# Patient Record
Sex: Female | Born: 1935 | Race: White | Hispanic: No | Smoking: Never smoker
Health system: Southern US, Community
[De-identification: ages and names within clinical notes are randomized; demographics above are authoritative.]

## PROBLEM LIST (undated history)

## (undated) DIAGNOSIS — I10 Essential (primary) hypertension: Secondary | ICD-10-CM

## (undated) DIAGNOSIS — E079 Disorder of thyroid, unspecified: Secondary | ICD-10-CM

## (undated) DIAGNOSIS — J449 Chronic obstructive pulmonary disease, unspecified: Secondary | ICD-10-CM

## (undated) DIAGNOSIS — J45909 Unspecified asthma, uncomplicated: Secondary | ICD-10-CM

## (undated) DIAGNOSIS — E785 Hyperlipidemia, unspecified: Secondary | ICD-10-CM

## (undated) HISTORY — PX: TONSILLECTOMY: SUR1361

---

## 2018-09-09 ENCOUNTER — Encounter (HOSPITAL_BASED_OUTPATIENT_CLINIC_OR_DEPARTMENT_OTHER): Payer: Self-pay | Admitting: Emergency Medicine

## 2018-09-09 ENCOUNTER — Emergency Department (HOSPITAL_BASED_OUTPATIENT_CLINIC_OR_DEPARTMENT_OTHER)
Admission: EM | Admit: 2018-09-09 | Discharge: 2018-09-09 | Disposition: A | Discharge status: Home / Self Care | Payer: Medicare Other | Attending: Emergency Medicine | Admitting: Emergency Medicine

## 2018-09-09 ENCOUNTER — Emergency Department (HOSPITAL_BASED_OUTPATIENT_CLINIC_OR_DEPARTMENT_OTHER): Payer: Medicare Other

## 2018-09-09 ENCOUNTER — Other Ambulatory Visit: Payer: Self-pay

## 2018-09-09 DIAGNOSIS — J209 Acute bronchitis, unspecified: Secondary | ICD-10-CM | POA: Insufficient documentation

## 2018-09-09 DIAGNOSIS — R0602 Shortness of breath: Secondary | ICD-10-CM | POA: Diagnosis present

## 2018-09-09 HISTORY — DX: Hyperlipidemia, unspecified: E78.5

## 2018-09-09 HISTORY — DX: Essential (primary) hypertension: I10

## 2018-09-09 HISTORY — DX: Unspecified asthma, uncomplicated: J45.909

## 2018-09-09 HISTORY — DX: Disorder of thyroid, unspecified: E07.9

## 2018-09-09 LAB — BASIC METABOLIC PANEL
Anion gap: 6 (ref 5–15)
BUN: 22 mg/dL (ref 8–23)
CO2: 29 mmol/L (ref 22–32)
Calcium: 8.9 mg/dL (ref 8.9–10.3)
Chloride: 105 mmol/L (ref 98–111)
Creatinine, Ser: 0.96 mg/dL (ref 0.44–1.00)
GFR calc Af Amer: 60 mL/min — ABNORMAL LOW (ref >60)
GFR calc non Af Amer: 51 mL/min — ABNORMAL LOW (ref >60)
Glucose, Bld: 131 mg/dL — ABNORMAL HIGH (ref 70–99)
Potassium: 3.4 mmol/L — ABNORMAL LOW (ref 3.5–5.1)
Sodium: 140 mmol/L (ref 135–145)

## 2018-09-09 LAB — CBC WITH DIFFERENTIAL/PLATELET
Abs Immature Granulocytes: 0.03 10*3/uL (ref 0.00–0.07)
Basophils Absolute: 0.1 10*3/uL (ref 0.0–0.1)
Basophils Relative: 1 %
Eosinophils Absolute: 1 10*3/uL — ABNORMAL HIGH (ref 0.0–0.5)
Eosinophils Relative: 12 %
HCT: 42.9 % (ref 36.0–46.0)
Hemoglobin: 13.9 g/dL (ref 12.0–15.0)
Immature Granulocytes: 0 %
Lymphocytes Relative: 21 %
Lymphs Abs: 1.7 10*3/uL (ref 0.7–4.0)
MCH: 31.4 pg (ref 26.0–34.0)
MCHC: 32.4 g/dL (ref 30.0–36.0)
MCV: 97.1 fL (ref 80.0–100.0)
Monocytes Absolute: 1.1 10*3/uL — ABNORMAL HIGH (ref 0.1–1.0)
Monocytes Relative: 14 %
Neutro Abs: 4.2 10*3/uL (ref 1.7–7.7)
Neutrophils Relative %: 52 %
Platelets: 258 10*3/uL (ref 150–400)
RBC: 4.42 MIL/uL (ref 3.87–5.11)
RDW: 13.9 % (ref 11.5–15.5)
WBC: 8.1 10*3/uL (ref 4.0–10.5)
nRBC: 0 % (ref 0.0–0.2)

## 2018-09-09 MED ORDER — METHYLPREDNISOLONE SODIUM SUCC 125 MG IJ SOLR
125.0000 mg | Freq: Once | INTRAMUSCULAR | Status: AC
  Administered 2018-09-09: 13:00:00 125 mg via INTRAVENOUS
  Filled 2018-09-09: qty 2

## 2018-09-09 MED ORDER — DOXYCYCLINE HYCLATE 100 MG PO CAPS: 100.0000 mg | ORAL_CAPSULE | Freq: Two times a day (BID) | ORAL | 0 refills | Status: DC

## 2018-09-09 MED ORDER — PREDNISONE 20 MG PO TABS: 60.0000 mg | ORAL_TABLET | Freq: Every day | ORAL | 0 refills | Status: DC

## 2018-09-09 MED ORDER — IPRATROPIUM-ALBUTEROL 0.5-2.5 (3) MG/3ML IN SOLN
3.0000 mL | Freq: Once | RESPIRATORY_TRACT | Status: AC
  Administered 2018-09-09: 12:00:00 3 mL via RESPIRATORY_TRACT
  Filled 2018-09-09: qty 3

## 2018-09-09 NOTE — Discharge Instructions (Signed)
You were seen in the emergency department for increased shortness of breath.  You had lab work and a chest x-ray that did not show an obvious pneumonia.  We are treating you for bronchitis with a steroid called prednisone and an antibiotic called doxycycline.  Please finish your medications.  You can continue to use your inhaler every 4-6 hours as needed.  Follow-up with your doctor.  Return if any worsening symptoms.

## 2018-09-09 NOTE — ED Triage Notes (Signed)
SOB x1 month. Worse today, cough, with green mucus

## 2018-09-09 NOTE — ED Provider Notes (Signed)
[facility] EMERGENCY DEPARTMENT Provider Note   CSN: [id number] Arrival date & time: 09/09/18  1140    History   Chief Complaint Chief Complaint  Patient presents with  . Shortness of Breath    HPI Lynn Knox is a 85 y.o. female.  She presents the emergency department with complaint of 1 week of increased shortness of breath.  She said she gets this every spring when the pollen comes out and it causes her to wheeze.  She has been using her albuterol with some improvement.  She is coughing and intermittently will get up some sputum.  She said she had to call 911 last night because of her shortness of breath but she refused transport.  No fevers or chills no sick contacts or recent travel.     The history is provided by the patient.  Shortness of Breath  Severity:  Moderate Onset quality:  Gradual Timing:  Constant Progression:  Waxing and waning Chronicity:  Recurrent Context: pollens   Relieved by:  None tried Worsened by:  Activity and coughing Ineffective treatments:  Inhaler Associated symptoms: cough, sputum production and wheezing   Associated symptoms: no abdominal pain, no chest pain, no diaphoresis, no fever, no headaches, no hemoptysis, no neck pain, no rash, no sore throat and no vomiting     No past medical history on file.  There are no active problems to display for this patient.   Past Surgical History:  Procedure Laterality Date  . TONSILLECTOMY       OB History   No obstetric history on file.      Home Medications    Prior to Admission medications   Medication Sig Start Date End Date Taking? Authorizing Provider  doxycycline (VIBRAMYCIN) 100 MG capsule Take 1 capsule (100 mg total) by mouth 2 (two) times daily. 09/09/18   Terrilee FilesButler, Michael C, MD  predniSONE (DELTASONE) 20 MG tablet Take 3 tablets (60 mg total) by mouth daily. 09/09/18   Terrilee FilesButler, Michael C, MD    Family History No family history on file.  Social History Social History    Tobacco Use  . Smoking status: Never Smoker  . Smokeless tobacco: Never Used  Substance Use Topics  . Alcohol use: Never    Frequency: Never  . Drug use: Never     Allergies   Patient has no allergy information on record.   Review of Systems Review of Systems  Constitutional: Negative for diaphoresis and fever.  HENT: Negative for sore throat.   Eyes: Negative for visual disturbance.  Respiratory: Positive for cough, sputum production, shortness of breath and wheezing. Negative for hemoptysis.   Cardiovascular: Negative for chest pain.  Gastrointestinal: Negative for abdominal pain and vomiting.  Genitourinary: Negative for dysuria.  Musculoskeletal: Negative for neck pain.  Skin: Negative for rash.  Neurological: Negative for headaches.     Physical Exam Updated Vital Signs BP (!) 142/62 (BP Location: Left Arm)   Pulse 95   Temp 98.1 F (36.7 C) (Oral)   Resp 16   SpO2 94%   Physical Exam Vitals signs and nursing note reviewed.  Constitutional:      General: She is not in acute distress.    Appearance: She is well-developed.  HENT:     Head: Normocephalic and atraumatic.  Eyes:     Conjunctiva/sclera: Conjunctivae normal.  Neck:     Musculoskeletal: Neck supple.  Cardiovascular:     Rate and Rhythm: Normal rate and regular rhythm.  Heart sounds: No murmur.  Pulmonary:     Effort: Pulmonary effort is normal. No respiratory distress.     Breath sounds: Wheezing present.  Abdominal:     Palpations: Abdomen is soft.     Tenderness: There is no abdominal tenderness.  Musculoskeletal:        General: No tenderness or signs of injury.     Right lower leg: Edema present.     Left lower leg: Edema present.  Skin:    General: Skin is warm and dry.     Capillary Refill: Capillary refill takes less than 2 seconds.  Neurological:     General: No focal deficit present.     Mental Status: She is alert. Mental status is at baseline.      ED Treatments /  Results  Labs (all labs ordered are listed, but only abnormal results are displayed) Labs Reviewed  BASIC METABOLIC PANEL - Abnormal; Notable for the following components:      Result Value   Potassium 3.4 (*)    Glucose, Bld 131 (*)    GFR calc non Af Amer 51 (*)    GFR calc Af Amer 60 (*)    All other components within normal limits  CBC WITH DIFFERENTIAL/PLATELET - Abnormal; Notable for the following components:   Monocytes Absolute 1.1 (*)    Eosinophils Absolute 1.0 (*)    All other components within normal limits    EKG EKG Interpretation  Date/Time:  Saturday Sep 09 2018 12:24:56 EDT Ventricular Rate:  86 PR Interval:    QRS Duration: 93 QT Interval:  404 QTC Calculation: 484 R Axis:   -12 Text Interpretation:  Sinus rhythm Low voltage, extremity and precordial leads Baseline wander in lead(s) V2 V5 no prior to compare with Confirmed by Meridee Score [phone]) on 09/09/2018 12:27:16 PM   Radiology Dg Chest Port 1 View  Result Date: 09/09/2018 CLINICAL DATA:  Shortness of breath for 1 month EXAM: PORTABLE CHEST 1 VIEW COMPARISON:  None. FINDINGS: The lungs are hyperinflated likely secondary to COPD. There is no focal parenchymal opacity. There is no pleural effusion or pneumothorax. The heart and mediastinal contours are unremarkable. The osseous structures are unremarkable. IMPRESSION: No active disease. Electronically Signed   By: Elige Ko   On: 09/09/2018 12:56    Procedures Procedures (including critical care time)  Medications Ordered in ED Medications  ipratropium-albuterol (DUONEB) 0.5-2.5 (3) MG/3ML nebulizer solution 3 mL (has no administration in time range)  methylPREDNISolone sodium succinate (SOLU-MEDROL) 125 mg/2 mL injection 125 mg (has no administration in time range)     Initial Impression / Assessment and Plan / ED Course  I have reviewed the triage vital signs and the nursing notes.  Pertinent labs & imaging results that were available during  my care of the patient were reviewed by me and considered in my medical decision making (see chart for details).  Clinical Course as of Sep 09 1839  Sat Sep 09, 2018  3049 84 year old female with history of seasonal allergies and shortness of breath here with increased shortness of breath over the last week or so.  She has diffuse wheezing on exam although is satting 94% on room air.  Speaking in full sentences.  Differential diagnosis includes reactive airway disease, pneumonia, Covid, bronchitis/COPD.   [MB]  1306 Improved symptoms after DuoNeb.  Labs unremarkable and chest x-ray with no active disease.  Will send home with a prescription for antibiotics and steroids.   [MB]  Clinical Course User Index [MB] Terrilee Files, MD   Earlyn Sylvan was evaluated in Emergency Department on 09/09/2018 for the symptoms described in the history of present illness. She was evaluated in the context of the global COVID-19 pandemic, which necessitated consideration that the patient might be at risk for infection with the SARS-CoV-2 virus that causes COVID-19. Institutional protocols and algorithms that pertain to the evaluation of patients at risk for COVID-19 are in a state of rapid change based on information released by regulatory bodies including the CDC and federal and state organizations. These policies and algorithms were followed during the patient's care in the ED.      Final Clinical Impressions(s) / ED Diagnoses   Final diagnoses:  SOB (shortness of breath)  Acute bronchitis, unspecified organism    ED Discharge Orders         Ordered    predniSONE (DELTASONE) 20 MG tablet  Daily     09/09/18 1235    doxycycline (VIBRAMYCIN) 100 MG capsule  2 times daily     09/09/18 1235           Terrilee Files, MD 09/09/18 1842

## 2018-12-26 ENCOUNTER — Emergency Department (HOSPITAL_BASED_OUTPATIENT_CLINIC_OR_DEPARTMENT_OTHER): Payer: Medicare Other

## 2018-12-26 ENCOUNTER — Other Ambulatory Visit: Payer: Self-pay

## 2018-12-26 ENCOUNTER — Emergency Department (HOSPITAL_BASED_OUTPATIENT_CLINIC_OR_DEPARTMENT_OTHER)
Admission: EM | Admit: 2018-12-26 | Discharge: 2018-12-26 | Disposition: A | Discharge status: Home / Self Care | Payer: Medicare Other | Attending: Emergency Medicine | Admitting: Emergency Medicine

## 2018-12-26 ENCOUNTER — Encounter (HOSPITAL_BASED_OUTPATIENT_CLINIC_OR_DEPARTMENT_OTHER): Payer: Self-pay

## 2018-12-26 DIAGNOSIS — Z20828 Contact with and (suspected) exposure to other viral communicable diseases: Secondary | ICD-10-CM | POA: Insufficient documentation

## 2018-12-26 DIAGNOSIS — J441 Chronic obstructive pulmonary disease with (acute) exacerbation: Secondary | ICD-10-CM | POA: Insufficient documentation

## 2018-12-26 DIAGNOSIS — R05 Cough: Secondary | ICD-10-CM

## 2018-12-26 DIAGNOSIS — E079 Disorder of thyroid, unspecified: Secondary | ICD-10-CM | POA: Insufficient documentation

## 2018-12-26 DIAGNOSIS — I10 Essential (primary) hypertension: Secondary | ICD-10-CM | POA: Diagnosis not present

## 2018-12-26 DIAGNOSIS — E785 Hyperlipidemia, unspecified: Secondary | ICD-10-CM | POA: Insufficient documentation

## 2018-12-26 DIAGNOSIS — Z7952 Long term (current) use of systemic steroids: Secondary | ICD-10-CM | POA: Insufficient documentation

## 2018-12-26 DIAGNOSIS — Z881 Allergy status to other antibiotic agents status: Secondary | ICD-10-CM | POA: Insufficient documentation

## 2018-12-26 DIAGNOSIS — R059 Cough, unspecified: Secondary | ICD-10-CM

## 2018-12-26 DIAGNOSIS — R0602 Shortness of breath: Secondary | ICD-10-CM | POA: Diagnosis present

## 2018-12-26 LAB — LACTIC ACID, PLASMA: Lactic Acid, Venous: 1.4 mmol/L (ref 0.5–1.9)

## 2018-12-26 LAB — CBC
HCT: 42.6 % (ref 36.0–46.0)
Hemoglobin: 13.5 g/dL (ref 12.0–15.0)
MCH: 31.3 pg (ref 26.0–34.0)
MCHC: 31.7 g/dL (ref 30.0–36.0)
MCV: 98.8 fL (ref 80.0–100.0)
Platelets: 331 10*3/uL (ref 150–400)
RBC: 4.31 MIL/uL (ref 3.87–5.11)
RDW: 13.7 % (ref 11.5–15.5)
WBC: 9.4 10*3/uL (ref 4.0–10.5)
nRBC: 0 % (ref 0.0–0.2)

## 2018-12-26 LAB — BASIC METABOLIC PANEL
Anion gap: 11 (ref 5–15)
BUN: 26 mg/dL — ABNORMAL HIGH (ref 8–23)
CO2: 26 mmol/L (ref 22–32)
Calcium: 9.4 mg/dL (ref 8.9–10.3)
Chloride: 104 mmol/L (ref 98–111)
Creatinine, Ser: 1.15 mg/dL — ABNORMAL HIGH (ref 0.44–1.00)
GFR calc Af Amer: 48 mL/min — ABNORMAL LOW (ref >60)
GFR calc non Af Amer: 41 mL/min — ABNORMAL LOW (ref >60)
Glucose, Bld: 158 mg/dL — ABNORMAL HIGH (ref 70–99)
Potassium: 3.5 mmol/L (ref 3.5–5.1)
Sodium: 141 mmol/L (ref 135–145)

## 2018-12-26 LAB — BRAIN NATRIURETIC PEPTIDE: B Natriuretic Peptide: 88.4 pg/mL (ref 0.0–100.0)

## 2018-12-26 LAB — SARS CORONAVIRUS 2 BY RT PCR (HOSPITAL ORDER, PERFORMED IN ~~LOC~~ HOSPITAL LAB): SARS Coronavirus 2: NEGATIVE

## 2018-12-26 LAB — TROPONIN I (HIGH SENSITIVITY): Troponin I (High Sensitivity): 10 ng/L (ref <18)

## 2018-12-26 MED ORDER — ALBUTEROL SULFATE (2.5 MG/3ML) 0.083% IN NEBU
INHALATION_SOLUTION | RESPIRATORY_TRACT | Status: AC
  Administered 2018-12-26: 5 mg
  Filled 2018-12-26: qty 6

## 2018-12-26 MED ORDER — METHYLPREDNISOLONE SODIUM SUCC 125 MG IJ SOLR
125.0000 mg | Freq: Once | INTRAMUSCULAR | Status: AC
  Administered 2018-12-26: 125 mg via INTRAVENOUS
  Filled 2018-12-26: qty 2

## 2018-12-26 MED ORDER — PREDNISONE 20 MG PO TABS: 60.0000 mg | ORAL_TABLET | Freq: Every day | ORAL | 0 refills | Status: DC

## 2018-12-26 MED ORDER — IPRATROPIUM-ALBUTEROL 0.5-2.5 (3) MG/3ML IN SOLN
3.0000 mL | Freq: Four times a day (QID) | RESPIRATORY_TRACT | Status: DC
  Administered 2018-12-26: 3 mL via RESPIRATORY_TRACT
  Filled 2018-12-26: qty 3

## 2018-12-26 MED ORDER — IPRATROPIUM BROMIDE 0.02 % IN SOLN
RESPIRATORY_TRACT | Status: AC
  Administered 2018-12-26: 0.5 mg
  Filled 2018-12-26: qty 2.5

## 2018-12-26 MED ORDER — AZITHROMYCIN 250 MG PO TABS: 250.0000 mg | ORAL_TABLET | Freq: Every day | ORAL | 0 refills | Status: DC

## 2018-12-26 NOTE — ED Provider Notes (Signed)
[facility] EMERGENCY DEPARTMENT Provider Note   CSN: [id number] Arrival date & time: 12/26/18  1941     History   Chief Complaint Chief Complaint  Patient presents with  . Shortness of Breath    HPI Lynn Knox is a 85 y.o. female.  She said she has been short of breath and coughing since she arrived here about 3 or 4 years ago.  Her son is here and gives a little bit of extra history of same she has had a course of antibiotics once or twice over the last year and seems to improve but then gets back being short of breath again.  She is never carried any pulmonary diagnosis but she was a former smoker.  No fever no chest pain.  She has had a cough productive of some dark-colored green sputum.  No abdominal pain vomiting or diarrhea.  Denies any Covid exposures.     The history is provided by the patient and a relative.  Shortness of Breath Severity:  Moderate Onset quality:  Unable to specify Timing:  Constant Progression:  Worsening Chronicity:  Recurrent Context: activity   Relieved by:  Nothing Worsened by:  Activity Ineffective treatments:  Inhaler Associated symptoms: cough and sputum production   Associated symptoms: no abdominal pain, no chest pain, no fever, no headaches, no hemoptysis, no neck pain, no rash, no sore throat and no vomiting     Past Medical History:  Diagnosis Date  . Asthma   . Hyperlipemia   . Hypertension   . Thyroid disease     There are no active problems to display for this patient.   Past Surgical History:  Procedure Laterality Date  . TONSILLECTOMY       OB History   No obstetric history on file.      Home Medications    Prior to Admission medications   Medication Sig Start Date End Date Taking? Authorizing Provider  doxycycline (VIBRAMYCIN) 100 MG capsule Take 1 capsule (100 mg total) by mouth 2 (two) times daily. 09/09/18   Terrilee FilesButler, Kiyoshi Schaab C, MD  predniSONE (DELTASONE) 20 MG tablet Take 3 tablets (60 mg total) by  mouth daily. 09/09/18   Terrilee FilesButler, Kelsei Defino C, MD    Family History No family history on file.  Social History Social History   Tobacco Use  . Smoking status: Never Smoker  . Smokeless tobacco: Never Used  Substance Use Topics  . Alcohol use: Never    Frequency: Never  . Drug use: Never     Allergies   Doxycycline   Review of Systems Review of Systems  Constitutional: Negative for fever.  HENT: Negative for sore throat.   Eyes: Negative for visual disturbance.  Respiratory: Positive for cough, sputum production and shortness of breath. Negative for hemoptysis.   Cardiovascular: Positive for leg swelling. Negative for chest pain.  Gastrointestinal: Negative for abdominal pain and vomiting.  Genitourinary: Negative for dysuria.  Musculoskeletal: Negative for neck pain.  Skin: Negative for rash.  Neurological: Negative for headaches.     Physical Exam Updated Vital Signs BP 136/78   Pulse 84   Temp 98 F (36.7 C) (Oral)   Resp 18   Wt 71.7 kg   SpO2 98%   BMI 29.85 kg/m   Physical Exam Vitals signs and nursing note reviewed.  Constitutional:      General: She is not in acute distress.    Appearance: She is well-developed.  HENT:     Head: Normocephalic and  atraumatic.  Eyes:     Conjunctiva/sclera: Conjunctivae normal.  Neck:     Musculoskeletal: Neck supple.  Cardiovascular:     Rate and Rhythm: Normal rate and regular rhythm.     Heart sounds: No murmur.  Pulmonary:     Effort: Tachypnea and accessory muscle usage present. No respiratory distress.     Breath sounds: Wheezing present.  Abdominal:     Palpations: Abdomen is soft.     Tenderness: There is no abdominal tenderness.  Musculoskeletal: Normal range of motion.     Right lower leg: She exhibits no tenderness. Edema present.     Left lower leg: She exhibits no tenderness. Edema present.  Skin:    General: Skin is warm and dry.     Capillary Refill: Capillary refill takes less than 2 seconds.   Neurological:     General: No focal deficit present.     Mental Status: She is alert. Mental status is at baseline.      ED Treatments / Results  Labs (all labs ordered are listed, but only abnormal results are displayed) Labs Reviewed  BASIC METABOLIC PANEL - Abnormal; Notable for the following components:      Result Value   Glucose, Bld 158 (*)    BUN 26 (*)    Creatinine, Ser 1.15 (*)    GFR calc non Af Amer 41 (*)    GFR calc Af Amer 48 (*)    All other components within normal limits  SARS CORONAVIRUS 2 (HOSPITAL ORDER, PERFORMED IN [facility])  LACTIC ACID, PLASMA  CBC  BRAIN NATRIURETIC PEPTIDE  TROPONIN I (HIGH SENSITIVITY)  TROPONIN I (HIGH SENSITIVITY)    EKG EKG Interpretation  Date/Time:  Tuesday December 26 2018 19:53:07 EDT Ventricular Rate:  108 PR Interval:    QRS Duration: 91 QT Interval:  347 QTC Calculation: 466 R Axis:   37 Text Interpretation:  Sinus tachycardia Low voltage, precordial leads similar to prior 5/20 Confirmed by Meridee ScoreButler, Zaccheaus Storlie [phone]([id number]) on 12/26/2018 8:03:31 PM   Radiology Dg Chest Port 1 View  Result Date: 12/26/2018 CLINICAL DATA:  Shortness of breath EXAM: PORTABLE CHEST 1 VIEW COMPARISON:  Sep 09, 2018 FINDINGS: The heart size is unchanged. Aortic calcifications are noted. The thoracic aorta is tortuous and likely enlarged. There is no pneumothorax. No significant pleural effusion. No acute osseous abnormality. IMPRESSION: 1. No acute cardiopulmonary process. 2. Tortuous and likely enlarged thoracic aorta, similar to prior x-ray dated Sep 09, 2018. Consider outpatient nonemergent CT for further evaluation of this finding. Electronically Signed   By: Katherine Mantlehristopher  Green M.D.   On: 12/26/2018 20:53    Procedures Procedures (including critical care time)  Medications Ordered in ED Medications  albuterol (PROVENTIL) (2.5 MG/3ML) 0.083% nebulizer solution (5 mg  Given 12/26/18 1955)  ipratropium (ATROVENT) 0.02 % nebulizer  solution (0.5 mg  Given 12/26/18 1955)  methylPREDNISolone sodium succinate (SOLU-MEDROL) 125 mg/2 mL injection 125 mg (125 mg Intravenous Given 12/26/18 2037)     Initial Impression / Assessment and Plan / ED Course  I have reviewed the triage vital signs and the nursing notes.  Pertinent labs & imaging results that were available during my care of the patient were reviewed by me and considered in my medical decision making (see chart for details).  Clinical Course as of Dec 26 913  Tue Dec 26, 2018  2107 Patient here with what sounds like a longstanding shortness of breath and wheezing.  She does not carry  the diagnosis of COPD.  Differential includes COPD, asthma, bronchitis, pneumonia, Covid, CHF.  BNP is reassuring at 88.  Rest of her lab work shows a normal white count normal lactic acid.  Chest x-ray is read as negative other than a tortuous aorta seen on priors.   [MB]  2107 She is received some breathing treatments and steroids here.   [MB]  2141 I reevaluated the patient.  She is satting well on room air and speaking in full sentences.  Her wheezing is improved although still ongoing.  Her lab work is fairly unremarkable and her chest x-ray and COVID testing were unremarkable.  We will give her some more neb here and likely can be discharged on steroids and inhaler treatments   [MB]    Clinical Course User Index [MB] Hayden Rasmussen, MD        Final Clinical Impressions(s) / ED Diagnoses   Final diagnoses:  COPD exacerbation [facility])    ED Discharge Orders         Ordered    predniSONE (DELTASONE) 20 MG tablet  Daily     12/26/18 2248    azithromycin (ZITHROMAX) 250 MG tablet  Daily     12/26/18 2248           Hayden Rasmussen, MD 12/27/18 [phone]

## 2018-12-26 NOTE — ED Notes (Signed)
ED Provider at bedside. 

## 2018-12-26 NOTE — ED Triage Notes (Signed)
Pt with increase in resp distress x 2 days-was treated with broad spectrum abx per grandson-pt taken to tx area via w/c-RT in upon arrival to tx area

## 2018-12-26 NOTE — ED Notes (Signed)
X-ray at bedside

## 2018-12-26 NOTE — Discharge Instructions (Signed)
You were seen in the emergency department for increased cough and shortness of breath.  You had blood work EKG and a chest x-ray that did not show any serious findings.  We are treating you for possible COPD with steroids and a course of antibiotics.  Please continue to use your inhaler.  It will be important for you to establish follow-up with a primary care doctor.  Return if any worsening symptoms.

## 2019-03-19 ENCOUNTER — Emergency Department (HOSPITAL_BASED_OUTPATIENT_CLINIC_OR_DEPARTMENT_OTHER)
Admission: EM | Admit: 2019-03-19 | Discharge: 2019-03-19 | Disposition: A | Discharge status: Home / Self Care | Payer: Medicare Other | Attending: Emergency Medicine | Admitting: Emergency Medicine

## 2019-03-19 ENCOUNTER — Encounter (HOSPITAL_BASED_OUTPATIENT_CLINIC_OR_DEPARTMENT_OTHER): Payer: Self-pay | Admitting: *Deleted

## 2019-03-19 ENCOUNTER — Other Ambulatory Visit: Payer: Self-pay

## 2019-03-19 ENCOUNTER — Emergency Department (HOSPITAL_BASED_OUTPATIENT_CLINIC_OR_DEPARTMENT_OTHER): Payer: Medicare Other

## 2019-03-19 DIAGNOSIS — J441 Chronic obstructive pulmonary disease with (acute) exacerbation: Secondary | ICD-10-CM | POA: Diagnosis not present

## 2019-03-19 DIAGNOSIS — Z79899 Other long term (current) drug therapy: Secondary | ICD-10-CM | POA: Diagnosis not present

## 2019-03-19 DIAGNOSIS — E079 Disorder of thyroid, unspecified: Secondary | ICD-10-CM | POA: Insufficient documentation

## 2019-03-19 DIAGNOSIS — R0602 Shortness of breath: Secondary | ICD-10-CM | POA: Diagnosis present

## 2019-03-19 DIAGNOSIS — I10 Essential (primary) hypertension: Secondary | ICD-10-CM | POA: Diagnosis not present

## 2019-03-19 DIAGNOSIS — Z881 Allergy status to other antibiotic agents status: Secondary | ICD-10-CM | POA: Diagnosis not present

## 2019-03-19 DIAGNOSIS — E785 Hyperlipidemia, unspecified: Secondary | ICD-10-CM | POA: Diagnosis not present

## 2019-03-19 LAB — BASIC METABOLIC PANEL
Anion gap: 10 (ref 5–15)
BUN: 23 mg/dL (ref 8–23)
CO2: 27 mmol/L (ref 22–32)
Calcium: 9.6 mg/dL (ref 8.9–10.3)
Chloride: 103 mmol/L (ref 98–111)
Creatinine, Ser: 0.98 mg/dL (ref 0.44–1.00)
GFR calc Af Amer: 58 mL/min — ABNORMAL LOW (ref >60)
GFR calc non Af Amer: 50 mL/min — ABNORMAL LOW (ref >60)
Glucose, Bld: 119 mg/dL — ABNORMAL HIGH (ref 70–99)
Potassium: 3.3 mmol/L — ABNORMAL LOW (ref 3.5–5.1)
Sodium: 140 mmol/L (ref 135–145)

## 2019-03-19 LAB — CBC
HCT: 43 % (ref 36.0–46.0)
Hemoglobin: 13.6 g/dL (ref 12.0–15.0)
MCH: 30.8 pg (ref 26.0–34.0)
MCHC: 31.6 g/dL (ref 30.0–36.0)
MCV: 97.3 fL (ref 80.0–100.0)
Platelets: 297 10*3/uL (ref 150–400)
RBC: 4.42 MIL/uL (ref 3.87–5.11)
RDW: 13.5 % (ref 11.5–15.5)
WBC: 8.2 10*3/uL (ref 4.0–10.5)
nRBC: 0 % (ref 0.0–0.2)

## 2019-03-19 MED ORDER — ALBUTEROL SULFATE (2.5 MG/3ML) 0.083% IN NEBU
5.0000 mg | INHALATION_SOLUTION | Freq: Once | RESPIRATORY_TRACT | Status: AC
  Administered 2019-03-19: 5 mg via RESPIRATORY_TRACT
  Filled 2019-03-19: qty 6

## 2019-03-19 MED ORDER — IPRATROPIUM BROMIDE 0.02 % IN SOLN
0.5000 mg | Freq: Once | RESPIRATORY_TRACT | Status: AC
  Administered 2019-03-19: 0.5 mg via RESPIRATORY_TRACT
  Filled 2019-03-19: qty 2.5

## 2019-03-19 MED ORDER — METHYLPREDNISOLONE SODIUM SUCC 125 MG IJ SOLR
125.0000 mg | Freq: Once | INTRAMUSCULAR | Status: AC
  Administered 2019-03-19: 125 mg via INTRAVENOUS
  Filled 2019-03-19: qty 2

## 2019-03-19 MED ORDER — PREDNISONE 20 MG PO TABS: 40.0000 mg | ORAL_TABLET | Freq: Every day | ORAL | 0 refills | Status: AC

## 2019-03-19 MED ORDER — AZITHROMYCIN 250 MG PO TABS: ORAL_TABLET | ORAL | 0 refills | Status: DC

## 2019-03-19 NOTE — ED Triage Notes (Signed)
Short of breath,  Coughing phellem  Dark color and green

## 2019-03-19 NOTE — Discharge Instructions (Addendum)
Take the medications as prescribed, follow-up with your primary care doctor later this week to make sure your symptoms are improving, return to the ED as needed for worsening symptoms

## 2019-03-19 NOTE — ED Provider Notes (Signed)
[facility] EMERGENCY DEPARTMENT Provider Note   CSN: [id number] Arrival date & time: 03/19/19  1809     History   Chief Complaint Chief Complaint  Patient presents with  . Shortness of Breath    HPI Lynn Knox is a 85 y.o. female.     HPI Patient presents to the emergency room for evaluation of shortness of breath.  Patient has a history of COPD.  She has had recurrent episodes in the past.  Patient started having symptoms again this past week.  Patient has been avoiding her doctor's office because of COVID-19 but her symptoms became too severe tonight.  She has been wheezing and has tried to use her inhaler but her son states she has a difficult time coordinating the inhaler use.  No fevers or chills.  No chest pain.  No leg swelling.  No other complaints. Past Medical History:  Diagnosis Date  . Asthma   . Hyperlipemia   . Hypertension   . Thyroid disease     There are no active problems to display for this patient.   Past Surgical History:  Procedure Laterality Date  . TONSILLECTOMY       OB History   No obstetric history on file.      Home Medications    Prior to Admission medications   Medication Sig Start Date End Date Taking? Authorizing Provider  albuterol (VENTOLIN HFA) 108 (90 Base) MCG/ACT inhaler Inhale 2 puffs into the lungs every 6 (six) hours as needed. 03/09/19   [provider]  azithromycin (ZITHROMAX Z-PAK) 250 MG tablet Take 2 tabs by mouth on day 1 then 1 tab by mouth daily on days 2-5 03/19/19   Linwood Dibbles, MD  hydrochlorothiazide (HYDRODIURIL) 25 MG tablet Take 25 mg by mouth daily. 01/14/19   [provider]  levothyroxine (SYNTHROID) 25 MCG tablet Take 25 mcg by mouth daily. 01/14/19   [provider]  lisinopril (ZESTRIL) 20 MG tablet Take 20 mg by mouth daily. 01/14/19   [provider]  pravastatin (PRAVACHOL) 20 MG tablet Take 20 mg by mouth daily. 01/14/19   [provider]  predniSONE  (DELTASONE) 20 MG tablet Take 2 tablets (40 mg total) by mouth daily for 5 days. 03/19/19 03/24/19  Linwood Dibbles, MD    Family History No family history on file.  Social History Social History   Tobacco Use  . Smoking status: Never Smoker  . Smokeless tobacco: Never Used  Substance Use Topics  . Alcohol use: Never    Frequency: Never  . Drug use: Never     Allergies   Doxycycline   Review of Systems Review of Systems  All other systems reviewed and are negative.    Physical Exam Updated Vital Signs BP (!) 145/48   Pulse 89   Temp 98.1 F (36.7 C) (Oral)   Resp (!) 21   Ht 1.626 m (5\' 4" )   Wt 74.8 kg   SpO2 97%   BMI 28.32 kg/m   Physical Exam Vitals signs and nursing note reviewed.  Constitutional:      General: She is not in acute distress.    Appearance: She is well-developed.  HENT:     Head: Normocephalic and atraumatic.     Right Ear: External ear normal.     Left Ear: External ear normal.  Eyes:     General: No scleral icterus.       Right eye: No discharge.  Left eye: No discharge.     Conjunctiva/sclera: Conjunctivae normal.  Neck:     Musculoskeletal: Neck supple.     Trachea: No tracheal deviation.  Cardiovascular:     Rate and Rhythm: Normal rate and regular rhythm.  Pulmonary:     Effort: Pulmonary effort is normal. No respiratory distress.     Breath sounds: No stridor. Wheezing present. No rales.     Comments: Able to speak in full sentences Abdominal:     General: Bowel sounds are normal. There is no distension.     Palpations: Abdomen is soft.     Tenderness: There is no abdominal tenderness. There is no guarding or rebound.  Musculoskeletal:        General: No tenderness.  Skin:    General: Skin is warm and dry.     Findings: No rash.  Neurological:     Mental Status: She is alert.     Cranial Nerves: No cranial nerve deficit (no facial droop, extraocular movements intact, no slurred speech).     Sensory: No sensory  deficit.     Motor: No abnormal muscle tone or seizure activity.     Coordination: Coordination normal.      ED Treatments / Results  Labs (all labs ordered are listed, but only abnormal results are displayed) Labs Reviewed  BASIC METABOLIC PANEL - Abnormal; Notable for the following components:      Result Value   Potassium 3.3 (*)    Glucose, Bld 119 (*)    GFR calc non Af Amer 50 (*)    GFR calc Af Amer 58 (*)    All other components within normal limits  CBC    EKG EKG Interpretation  Date/Time:  Monday March 19 2019 18:39:41 EST Ventricular Rate:  84 PR Interval:    QRS Duration: 81 QT Interval:  393 QTC Calculation: 465 R Axis:   56 Text Interpretation: Sinus rhythm Anteroseptal infarct, age indeterminate No significant change since last tracing Confirmed by Linwood DibblesKnapp, Latrelle Fuston [phone]([id number]) on 03/19/2019 8:01:55 PM   Radiology Dg Chest Portable 1 View  Result Date: 03/19/2019 CLINICAL DATA:  Shortness of breath EXAM: PORTABLE CHEST 1 VIEW COMPARISON:  December 26, 2018 FINDINGS: The heart size and mediastinal contours are within normal limits. Aortic knob calcifications are seen. Tortuous aorta is again noted. Both lungs are clear. No acute osseous abnormality. IMPRESSION: No acute cardiopulmonary process. Electronically Signed   By: Jonna ClarkBindu  Avutu M.D.   On: 03/19/2019 19:18    Procedures Procedures (including critical care time)  Medications Ordered in ED Medications  albuterol (PROVENTIL) (2.5 MG/3ML) 0.083% nebulizer solution 5 mg (5 mg Nebulization Given 03/19/19 1830)  ipratropium (ATROVENT) nebulizer solution 0.5 mg (0.5 mg Nebulization Given 03/19/19 1830)  albuterol (PROVENTIL) (2.5 MG/3ML) 0.083% nebulizer solution 5 mg (5 mg Nebulization Given 03/19/19 1913)  methylPREDNISolone sodium succinate (SOLU-MEDROL) 125 mg/2 mL injection 125 mg (125 mg Intravenous Given 03/19/19 1924)  albuterol (PROVENTIL) (2.5 MG/3ML) 0.083% nebulizer solution 5 mg (5 mg Nebulization Given  03/19/19 2113)     Initial Impression / Assessment and Plan / ED Course  I have reviewed the triage vital signs and the nursing notes.  Pertinent labs & imaging results that were available during my care of the patient were reviewed by me and considered in my medical decision making (see chart for details).  Clinical Course as of Mar 18 2205  Mon Mar 19, 2019  2009 Patient reexamined still wheezing.  I will give an  additional breathing treatment   [JK]  2155 Patient has slight wheezing still on repeat exam.  She however feeling much better.  She feels ready for discharge.   [JK]    Clinical Course User Index [JK] Dorie Rank, MD     Patient presented to the ED with complaints of shortness of breath.  Exam is consistent with a COPD exacerbation.  Patient only has been using inhaler at home.  It does not sound like she is on any maintenance meds.  She also does not have access to a nebulizer.  Patient improved with treatment in the ED.  She still was wheezing and I discussed admitting to the hospital overnight for further treatment.  Patient was adamant that she was feeling better and did not want to be admitted.  Her grandson is with her and will be taking her home.  Plan on discharge home with course of azithromycin and prednisone.  Warning signs and precautions discussed.  I also discussed COVID-19.  Grandson and patient do not feel like she has any significant risk for exposure.  They are not interested in a Covid test at this evening.  Lynn Knox was evaluated in Emergency Department on 03/19/2019 for the symptoms described in the history of present illness. She was evaluated in the context of the global COVID-19 pandemic, which necessitated consideration that the patient might be at risk for infection with the SARS-CoV-2 virus that causes COVID-19. Institutional protocols and algorithms that pertain to the evaluation of patients at risk for COVID-19 are in a state of rapid change based on  information released by regulatory bodies including the CDC and federal and state organizations. These policies and algorithms were followed during the patient's care in the ED.   Final Clinical Impressions(s) / ED Diagnoses   Final diagnoses:  COPD exacerbation [facility])    ED Discharge Orders         Ordered    predniSONE (DELTASONE) 20 MG tablet  Daily     03/19/19 2159    azithromycin (ZITHROMAX Z-PAK) 250 MG tablet     03/19/19 2159           Dorie Rank, MD 03/19/19 2208

## 2019-03-19 NOTE — ED Notes (Signed)
ED Provider at bedside. 

## 2019-04-15 ENCOUNTER — Other Ambulatory Visit: Payer: Self-pay

## 2019-04-15 ENCOUNTER — Encounter (HOSPITAL_BASED_OUTPATIENT_CLINIC_OR_DEPARTMENT_OTHER): Payer: Self-pay | Admitting: Emergency Medicine

## 2019-04-15 ENCOUNTER — Emergency Department (HOSPITAL_BASED_OUTPATIENT_CLINIC_OR_DEPARTMENT_OTHER): Payer: Medicare Other

## 2019-04-15 ENCOUNTER — Emergency Department (HOSPITAL_BASED_OUTPATIENT_CLINIC_OR_DEPARTMENT_OTHER)
Admission: EM | Admit: 2019-04-15 | Discharge: 2019-04-15 | Disposition: A | Discharge status: Home / Self Care | Payer: Medicare Other | Attending: Emergency Medicine | Admitting: Emergency Medicine

## 2019-04-15 DIAGNOSIS — J441 Chronic obstructive pulmonary disease with (acute) exacerbation: Secondary | ICD-10-CM | POA: Diagnosis not present

## 2019-04-15 DIAGNOSIS — E079 Disorder of thyroid, unspecified: Secondary | ICD-10-CM | POA: Diagnosis not present

## 2019-04-15 DIAGNOSIS — E785 Hyperlipidemia, unspecified: Secondary | ICD-10-CM | POA: Diagnosis not present

## 2019-04-15 DIAGNOSIS — I10 Essential (primary) hypertension: Secondary | ICD-10-CM | POA: Insufficient documentation

## 2019-04-15 DIAGNOSIS — Z79899 Other long term (current) drug therapy: Secondary | ICD-10-CM | POA: Diagnosis not present

## 2019-04-15 DIAGNOSIS — Z20828 Contact with and (suspected) exposure to other viral communicable diseases: Secondary | ICD-10-CM | POA: Diagnosis not present

## 2019-04-15 DIAGNOSIS — R0602 Shortness of breath: Secondary | ICD-10-CM | POA: Diagnosis present

## 2019-04-15 DIAGNOSIS — Z888 Allergy status to other drugs, medicaments and biological substances status: Secondary | ICD-10-CM | POA: Diagnosis not present

## 2019-04-15 HISTORY — DX: Chronic obstructive pulmonary disease, unspecified: J44.9

## 2019-04-15 LAB — BASIC METABOLIC PANEL
Anion gap: 10 (ref 5–15)
BUN: 19 mg/dL (ref 8–23)
CO2: 27 mmol/L (ref 22–32)
Calcium: 9.4 mg/dL (ref 8.9–10.3)
Chloride: 105 mmol/L (ref 98–111)
Creatinine, Ser: 0.99 mg/dL (ref 0.44–1.00)
GFR calc Af Amer: 57 mL/min — ABNORMAL LOW (ref >60)
GFR calc non Af Amer: 49 mL/min — ABNORMAL LOW (ref >60)
Glucose, Bld: 97 mg/dL (ref 70–99)
Potassium: 3.3 mmol/L — ABNORMAL LOW (ref 3.5–5.1)
Sodium: 142 mmol/L (ref 135–145)

## 2019-04-15 LAB — CBC
HCT: 40.6 % (ref 36.0–46.0)
Hemoglobin: 13 g/dL (ref 12.0–15.0)
MCH: 31 pg (ref 26.0–34.0)
MCHC: 32 g/dL (ref 30.0–36.0)
MCV: 96.9 fL (ref 80.0–100.0)
Platelets: 302 10*3/uL (ref 150–400)
RBC: 4.19 MIL/uL (ref 3.87–5.11)
RDW: 13.4 % (ref 11.5–15.5)
WBC: 7.6 10*3/uL (ref 4.0–10.5)
nRBC: 0 % (ref 0.0–0.2)

## 2019-04-15 LAB — BRAIN NATRIURETIC PEPTIDE: B Natriuretic Peptide: 73.9 pg/mL (ref 0.0–100.0)

## 2019-04-15 LAB — SARS CORONAVIRUS 2 AG (30 MIN TAT): SARS Coronavirus 2 Ag: NEGATIVE

## 2019-04-15 MED ORDER — PREDNISONE 50 MG PO TABS: 50.0000 mg | ORAL_TABLET | Freq: Every day | ORAL | 0 refills | Status: DC

## 2019-04-15 MED ORDER — ALBUTEROL SULFATE HFA 108 (90 BASE) MCG/ACT IN AERS
8.0000 | INHALATION_SPRAY | Freq: Once | RESPIRATORY_TRACT | Status: AC
  Administered 2019-04-15: 12:00:00 8 via RESPIRATORY_TRACT
  Filled 2019-04-15: qty 6.7

## 2019-04-15 MED ORDER — IPRATROPIUM BROMIDE 0.02 % IN SOLN
0.5000 mg | Freq: Once | RESPIRATORY_TRACT | Status: AC
  Administered 2019-04-15: 0.5 mg via RESPIRATORY_TRACT
  Filled 2019-04-15: qty 2.5

## 2019-04-15 MED ORDER — ALBUTEROL (5 MG/ML) CONTINUOUS INHALATION SOLN
10.0000 mg/h | INHALATION_SOLUTION | Freq: Once | RESPIRATORY_TRACT | Status: AC
  Administered 2019-04-15: 10 mg/h via RESPIRATORY_TRACT
  Filled 2019-04-15: qty 20

## 2019-04-15 MED ORDER — METHYLPREDNISOLONE SODIUM SUCC 125 MG IJ SOLR
125.0000 mg | Freq: Once | INTRAMUSCULAR | Status: AC
  Administered 2019-04-15: 125 mg via INTRAVENOUS
  Filled 2019-04-15: qty 2

## 2019-04-15 NOTE — ED Notes (Signed)
Family at bedside. 

## 2019-04-15 NOTE — ED Provider Notes (Signed)
[facility] EMERGENCY DEPARTMENT Provider Note   CSN: [id number] Arrival date & time: 04/15/19  1137     History   Chief Complaint Chief Complaint  Lynn Knox presents with  . Shortness of Breath    HPI Lynn Knox is a 85 y.o. female.     HPI Lynn Knox has a history of COPD.  Lynn Knox presents the ED with complaints of shortness of breath.  Lynn Knox states the symptoms started a few days ago.  Lynn Knox has been using her inhaler at home but her symptoms have persisted.  Lynn Knox is not having any trouble with any chest pain.  No fevers or chills.  No vomiting or diarrhea.  Lynn Knox has a history of similar episodes in the past.  Lynn Knox was seen in August of this year in the emergency room for similar symptoms.  Lynn Knox also saw her myself back on November 9 for a similar episode.  Lynn Knox has been doing well since her last visit in November and has not seen a primary care doctor. Past Medical History:  Diagnosis Date  . Asthma   . COPD (chronic obstructive pulmonary disease) (HCC)   . Hyperlipemia   . Hypertension   . Thyroid disease     There are no active problems to display for this Lynn Knox.   Past Surgical History:  Procedure Laterality Date  . TONSILLECTOMY       OB History   No obstetric history on file.      Home Medications    Prior to Admission medications   Medication Sig Start Date End Date Taking? Authorizing Provider  albuterol (VENTOLIN HFA) 108 (90 Base) MCG/ACT inhaler Inhale 2 puffs into the lungs every 6 (six) hours as needed. 03/09/19   [provider]  azithromycin (ZITHROMAX Z-PAK) 250 MG tablet Take 2 tabs by mouth on day 1 then 1 tab by mouth daily on days 2-5 03/19/19   Linwood DibblesKnapp, Eliyana Pagliaro, MD  hydrochlorothiazide (HYDRODIURIL) 25 MG tablet Take 25 mg by mouth daily. 01/14/19   [provider]  levothyroxine (SYNTHROID) 25 MCG tablet Take 25 mcg by mouth daily. 01/14/19   [provider]  lisinopril (ZESTRIL) 20 MG tablet Take 20 mg by mouth  daily. 01/14/19   [provider]  pravastatin (PRAVACHOL) 20 MG tablet Take 20 mg by mouth daily. 01/14/19   [provider]  predniSONE (DELTASONE) 50 MG tablet Take 1 tablet (50 mg total) by mouth daily. 04/15/19   Linwood DibblesKnapp, Naomee Nowland, MD    Family History No family history on file.  Social History Social History   Tobacco Use  . Smoking status: Never Smoker  . Smokeless tobacco: Never Used  Substance Use Topics  . Alcohol use: Never    Frequency: Never  . Drug use: Never     Allergies   Doxycycline   Review of Systems Review of Systems  All other systems reviewed and are negative.    Physical Exam Updated Vital Signs BP (!) 115/53   Pulse (!) 107   Temp 98.1 F (36.7 C) (Oral)   Resp 19   Ht 1.626 m (5\' 4" )   Wt 74.8 kg   SpO2 92%   BMI 28.32 kg/m   Physical Exam Vitals signs and nursing note reviewed.  Constitutional:      Appearance: Lynn Knox is well-developed. Lynn Knox is not diaphoretic.  HENT:     Head: Normocephalic and atraumatic.     Right Ear: External ear normal.     Left Ear: External ear  normal.  Eyes:     General: No scleral icterus.       Right eye: No discharge.        Left eye: No discharge.     Conjunctiva/sclera: Conjunctivae normal.  Neck:     Musculoskeletal: Neck supple.     Trachea: No tracheal deviation.  Cardiovascular:     Rate and Rhythm: Normal rate and regular rhythm.  Pulmonary:     Effort: Pulmonary effort is normal. Tachypnea present. No respiratory distress.     Breath sounds: No stridor. Wheezing present. No rales.     Comments: Able to speak in full sentences Abdominal:     General: Bowel sounds are normal. There is no distension.     Palpations: Abdomen is soft.     Tenderness: There is no abdominal tenderness. There is no guarding or rebound.  Musculoskeletal:        General: No tenderness.  Skin:    General: Skin is warm and dry.     Findings: No rash.  Neurological:     Mental Status: Lynn Knox is alert.      Cranial Nerves: No cranial nerve deficit (no facial droop, extraocular movements intact, no slurred speech).     Sensory: No sensory deficit.     Motor: No abnormal muscle tone or seizure activity.     Coordination: Coordination normal.      ED Treatments / Results  Labs (all labs ordered are listed, but only abnormal results are displayed) Labs Reviewed  BASIC METABOLIC PANEL - Abnormal; Notable for the following components:      Result Value   Potassium 3.3 (*)    GFR calc non Af Amer 49 (*)    GFR calc Af Amer 57 (*)    All other components within normal limits  SARS CORONAVIRUS 2 AG (30 MIN TAT)  CBC  BRAIN NATRIURETIC PEPTIDE    EKG EKG Interpretation  Date/Time:  Sunday April 15 2019 11:44:18 EST Ventricular Rate:  85 PR Interval:    QRS Duration: 73 QT Interval:  352 QTC Calculation: 419 R Axis:   16 Text Interpretation: Sinus rhythm Ventricular premature complex Low voltage, precordial leads No significant change since last tracing Confirmed by Dorie Rank [phone]) on 04/15/2019 11:47:10 AM   Radiology Dg Chest Port 1 View  Result Date: 04/15/2019 CLINICAL DATA:  Dyspnea for 3 days EXAM: PORTABLE CHEST 1 VIEW COMPARISON:  03/19/2019 FINDINGS: Calcified atherosclerotic changes in the thoracic aorta. Stable cardiomediastinal contours. Lungs are clear. No acute bone process. IMPRESSION: No active disease. Electronically Signed   By: Zetta Bills M.D.   On: 04/15/2019 13:23    Procedures Procedures (including critical care time)  Medications Ordered in ED Medications  albuterol (VENTOLIN HFA) 108 (90 Base) MCG/ACT inhaler 8 puff (8 puffs Inhalation Given 04/15/19 1200)  albuterol (PROVENTIL,VENTOLIN) solution continuous neb (10 mg/hr Nebulization Given 04/15/19 1252)  ipratropium (ATROVENT) nebulizer solution 0.5 mg (0.5 mg Nebulization Given 04/15/19 1252)  methylPREDNISolone sodium succinate (SOLU-MEDROL) 125 mg/2 mL injection 125 mg (125 mg Intravenous Given  04/15/19 1229)     Initial Impression / Assessment and Plan / ED Course  Lynn Knox have reviewed the triage vital signs and the nursing notes.  Pertinent labs & imaging results that were available during my care of the Lynn Knox were reviewed by me and considered in my medical decision making (see chart for details).  Clinical Course as of Apr 14 1550  Sun Apr 15, 2019  1422 Labs reviewed.  No significant abnormalities.   [JK]  1422 Lynn Knox states Lynn Knox is feeling better and would like to go home.   [JK]    Clinical Course User Index [JK] Linwood Dibbles, MD     Lynn Knox presented to ED with recurrent COPD exacerbation.  Lynn Knox has a history of similar episodes in the past.  Lynn Knox improved with breathing treatment and steroids.  Laboratory testing Covid test were reassuring/negative.  Lynn Knox felt like Lynn Knox was breathing much better and was ready for discharge.  Stressed the importance of outpatient follow-up.  Lynn Knox was evaluated in Emergency Department on 04/15/2019 for the symptoms described in the history of present illness. Lynn Knox was evaluated in the context of the global COVID-19 pandemic, which necessitated consideration that the Lynn Knox might be at risk for infection with the SARS-CoV-2 virus that causes COVID-19. Institutional protocols and algorithms that pertain to the evaluation of patients at risk for COVID-19 are in a state of rapid change based on information released by regulatory bodies including the CDC and federal and state organizations. These policies and algorithms were followed during the Lynn Knox's care in the ED.   Final Clinical Impressions(s) / ED Diagnoses   Final diagnoses:  COPD exacerbation [facility])    ED Discharge Orders         Ordered    predniSONE (DELTASONE) 50 MG tablet  Daily     04/15/19 1441           Linwood Dibbles, MD 04/15/19 1552

## 2019-04-15 NOTE — ED Triage Notes (Signed)
SOB x 3 days. Hx of COPD

## 2019-04-15 NOTE — Discharge Instructions (Signed)
Take the steroids as prescribed, follow-up with your primary care doctor to make sure you are improving

## 2019-05-11 ENCOUNTER — Other Ambulatory Visit: Payer: Self-pay

## 2019-05-11 ENCOUNTER — Emergency Department (HOSPITAL_BASED_OUTPATIENT_CLINIC_OR_DEPARTMENT_OTHER): Payer: Medicare Other

## 2019-05-11 ENCOUNTER — Encounter (HOSPITAL_BASED_OUTPATIENT_CLINIC_OR_DEPARTMENT_OTHER): Payer: Self-pay | Admitting: *Deleted

## 2019-05-11 ENCOUNTER — Emergency Department (HOSPITAL_BASED_OUTPATIENT_CLINIC_OR_DEPARTMENT_OTHER)
Admission: EM | Admit: 2019-05-11 | Discharge: 2019-05-11 | Disposition: A | Discharge status: Home / Self Care | Payer: Medicare Other | Attending: Emergency Medicine | Admitting: Emergency Medicine

## 2019-05-11 DIAGNOSIS — J441 Chronic obstructive pulmonary disease with (acute) exacerbation: Secondary | ICD-10-CM | POA: Diagnosis not present

## 2019-05-11 DIAGNOSIS — Z79899 Other long term (current) drug therapy: Secondary | ICD-10-CM | POA: Insufficient documentation

## 2019-05-11 DIAGNOSIS — I10 Essential (primary) hypertension: Secondary | ICD-10-CM | POA: Diagnosis not present

## 2019-05-11 DIAGNOSIS — Z20822 Contact with and (suspected) exposure to covid-19: Secondary | ICD-10-CM | POA: Insufficient documentation

## 2019-05-11 DIAGNOSIS — R0602 Shortness of breath: Secondary | ICD-10-CM | POA: Diagnosis present

## 2019-05-11 LAB — CBC WITH DIFFERENTIAL/PLATELET
Abs Immature Granulocytes: 0.04 10*3/uL (ref 0.00–0.07)
Basophils Absolute: 0.1 10*3/uL (ref 0.0–0.1)
Basophils Relative: 2 %
Eosinophils Absolute: 0.9 10*3/uL — ABNORMAL HIGH (ref 0.0–0.5)
Eosinophils Relative: 11 %
HCT: 42.2 % (ref 36.0–46.0)
Hemoglobin: 13.6 g/dL (ref 12.0–15.0)
Immature Granulocytes: 1 %
Lymphocytes Relative: 24 %
Lymphs Abs: 2 10*3/uL (ref 0.7–4.0)
MCH: 31.6 pg (ref 26.0–34.0)
MCHC: 32.2 g/dL (ref 30.0–36.0)
MCV: 98.1 fL (ref 80.0–100.0)
Monocytes Absolute: 1.1 10*3/uL — ABNORMAL HIGH (ref 0.1–1.0)
Monocytes Relative: 14 %
Neutro Abs: 4 10*3/uL (ref 1.7–7.7)
Neutrophils Relative %: 48 %
Platelets: 281 10*3/uL (ref 150–400)
RBC: 4.3 MIL/uL (ref 3.87–5.11)
RDW: 13.8 % (ref 11.5–15.5)
WBC: 8.3 10*3/uL (ref 4.0–10.5)
nRBC: 0 % (ref 0.0–0.2)

## 2019-05-11 LAB — COMPREHENSIVE METABOLIC PANEL
ALT: 15 U/L (ref 0–44)
AST: 18 U/L (ref 15–41)
Albumin: 3.8 g/dL (ref 3.5–5.0)
Alkaline Phosphatase: 47 U/L (ref 38–126)
Anion gap: 11 (ref 5–15)
BUN: 22 mg/dL (ref 8–23)
CO2: 27 mmol/L (ref 22–32)
Calcium: 9.1 mg/dL (ref 8.9–10.3)
Chloride: 104 mmol/L (ref 98–111)
Creatinine, Ser: 1.05 mg/dL — ABNORMAL HIGH (ref 0.44–1.00)
GFR calc Af Amer: 53 mL/min — ABNORMAL LOW (ref >60)
GFR calc non Af Amer: 46 mL/min — ABNORMAL LOW (ref >60)
Glucose, Bld: 122 mg/dL — ABNORMAL HIGH (ref 70–99)
Potassium: 3.3 mmol/L — ABNORMAL LOW (ref 3.5–5.1)
Sodium: 142 mmol/L (ref 135–145)
Total Bilirubin: 0.6 mg/dL (ref 0.3–1.2)
Total Protein: 6.5 g/dL (ref 6.5–8.1)

## 2019-05-11 MED ORDER — FLOVENT HFA 44 MCG/ACT IN AERO: 2.0000 | INHALATION_SPRAY | Freq: Two times a day (BID) | RESPIRATORY_TRACT | 12 refills | Status: DC

## 2019-05-11 MED ORDER — IPRATROPIUM BROMIDE HFA 17 MCG/ACT IN AERS
6.0000 | INHALATION_SPRAY | Freq: Once | RESPIRATORY_TRACT | Status: AC
  Administered 2019-05-11: 19:00:00 6 via RESPIRATORY_TRACT

## 2019-05-11 MED ORDER — PREDNISONE 50 MG PO TABS
60.0000 mg | ORAL_TABLET | Freq: Once | ORAL | Status: AC
  Administered 2019-05-11: 60 mg via ORAL
  Filled 2019-05-11: qty 1

## 2019-05-11 MED ORDER — ATROVENT HFA 17 MCG/ACT IN AERS: 2.0000 | INHALATION_SPRAY | RESPIRATORY_TRACT | 12 refills | Status: DC | PRN

## 2019-05-11 MED ORDER — FLUTICASONE PROPIONATE HFA 44 MCG/ACT IN AERO
2.0000 | INHALATION_SPRAY | Freq: Once | RESPIRATORY_TRACT | Status: AC
  Administered 2019-05-11: 2 via RESPIRATORY_TRACT
  Filled 2019-05-11: qty 10.6

## 2019-05-11 MED ORDER — ALBUTEROL SULFATE HFA 108 (90 BASE) MCG/ACT IN AERS
6.0000 | INHALATION_SPRAY | Freq: Once | RESPIRATORY_TRACT | Status: AC
  Administered 2019-05-11: 6 via RESPIRATORY_TRACT

## 2019-05-11 MED ORDER — PREDNISONE 10 MG PO TABS: 40.0000 mg | ORAL_TABLET | Freq: Every day | ORAL | 0 refills | Status: AC

## 2019-05-11 NOTE — ED Provider Notes (Signed)
[facility] EMERGENCY DEPARTMENT Provider Note   CSN: [id number] Arrival date & time: 05/11/19  1859     History Chief Complaint  Patient presents with  . Shortness of Breath    Lynn Knox is a 86 y.o. female.  HPI      86yo female with history of COPD, htn, hlpd, presents with concern for shortness of breath.  Yesterday began having dyspnea, used the albuterol, it helped at home but breathing kept getting worse.  No cough. No fever. No chest pain.  NO leg swelling or pain. Feeling improved after albuterol in triage.  Has had more frequent COPD exacerbations over the last few months.  Is not on daily medication. Reports using her albuterol every hour over the last day.  Only temporary improvement at home with treatments but feeling better after treatments here. No sick contacts.     Past Medical History:  Diagnosis Date  . Asthma   . COPD (chronic obstructive pulmonary disease) (HCC)   . Hyperlipemia   . Hypertension   . Thyroid disease     There are no problems to display for this patient.   Past Surgical History:  Procedure Laterality Date  . TONSILLECTOMY       OB History   No obstetric history on file.     No family history on file.  Social History   Tobacco Use  . Smoking status: Never Smoker  . Smokeless tobacco: Never Used  Substance Use Topics  . Alcohol use: Never  . Drug use: Never    Home Medications Prior to Admission medications   Medication Sig Start Date End Date Taking? Authorizing Provider  albuterol (VENTOLIN HFA) 108 (90 Base) MCG/ACT inhaler Inhale 2 puffs into the lungs every 6 (six) hours as needed. 03/09/19   [provider]  azithromycin (ZITHROMAX Z-PAK) 250 MG tablet Take 2 tabs by mouth on day 1 then 1 tab by mouth daily on days 2-5 03/19/19   Linwood Dibbles, MD  fluticasone (FLOVENT HFA) 44 MCG/ACT inhaler Inhale 2 puffs into the lungs 2 (two) times daily. 05/11/19   Alvira Monday, MD  hydrochlorothiazide  (HYDRODIURIL) 25 MG tablet Take 25 mg by mouth daily. 01/14/19   [provider]  ipratropium (ATROVENT HFA) 17 MCG/ACT inhaler Inhale 2 puffs into the lungs every 4 (four) hours as needed for wheezing. 05/11/19   Alvira Monday, MD  levothyroxine (SYNTHROID) 25 MCG tablet Take 25 mcg by mouth daily. 01/14/19   [provider]  lisinopril (ZESTRIL) 20 MG tablet Take 20 mg by mouth daily. 01/14/19   [provider]  pravastatin (PRAVACHOL) 20 MG tablet Take 20 mg by mouth daily. 01/14/19   [provider]  predniSONE (DELTASONE) 10 MG tablet Take 4 tablets (40 mg total) by mouth daily for 4 days. 05/12/19 05/16/19  Alvira Monday, MD    Allergies    Doxycycline  Review of Systems   Review of Systems  Constitutional: Negative for fever.  HENT: Negative for sore throat.   Eyes: Negative for visual disturbance.  Respiratory: Positive for cough, shortness of breath and wheezing.   Cardiovascular: Negative for chest pain.  Gastrointestinal: Negative for abdominal pain, nausea and vomiting.  Genitourinary: Negative for difficulty urinating.  Musculoskeletal: Negative for neck pain.  Skin: Negative for rash.  Neurological: Negative for syncope and headaches.    Physical Exam Updated Vital Signs BP (!) 150/68   Pulse 90   Temp 97.7 F (36.5 C) (Oral)   Resp  20   Ht 5\' 4"  (1.626 m)   Wt 74 kg   SpO2 94%   BMI 28.00 kg/m   Physical Exam Vitals and nursing note reviewed.  Constitutional:      General: She is not in acute distress.    Appearance: She is well-developed. She is not diaphoretic.  HENT:     Head: Normocephalic and atraumatic.  Eyes:     Conjunctiva/sclera: Conjunctivae normal.  Cardiovascular:     Rate and Rhythm: Normal rate and regular rhythm.     Heart sounds: Normal heart sounds. No murmur. No friction rub. No gallop.   Pulmonary:     Effort: Pulmonary effort is normal. No respiratory distress.     Breath sounds: Wheezing (diffuse)  present. No rales.  Abdominal:     General: There is no distension.     Palpations: Abdomen is soft.     Tenderness: There is no abdominal tenderness. There is no guarding.  Musculoskeletal:        General: No tenderness.     Cervical back: Normal range of motion.  Skin:    General: Skin is warm and dry.     Findings: No erythema or rash.  Neurological:     Mental Status: She is alert and oriented to person, place, and time.     ED Results / Procedures / Treatments   Labs (all labs ordered are listed, but only abnormal results are displayed) Labs Reviewed  CBC WITH DIFFERENTIAL/PLATELET - Abnormal; Notable for the following components:      Result Value   Monocytes Absolute 1.1 (*)    Eosinophils Absolute 0.9 (*)    All other components within normal limits  COMPREHENSIVE METABOLIC PANEL - Abnormal; Notable for the following components:   Potassium 3.3 (*)    Glucose, Bld 122 (*)    Creatinine, Ser 1.05 (*)    GFR calc non Af Amer 46 (*)    GFR calc Af Amer 53 (*)    All other components within normal limits  SARS CORONAVIRUS 2 (TAT 6-24 HRS)    EKG EKG Interpretation  Date/Time:  Friday May 11 2019 21:27:46 EST Ventricular Rate:  84 PR Interval:    QRS Duration: 79 QT Interval:  394 QTC Calculation: 466 R Axis:   11 Text Interpretation: Sinus rhythm Atrial premature complexes Baseline wander in lead(s) I III aVL aVF V1 V6 No significant change since last tracing Confirmed by [id number] (Alvira Monday) on 05/11/2019 10:14:52 PM   Radiology DG Chest Portable 1 View  Result Date: 05/11/2019 CLINICAL DATA:  Shortness of breath EXAM: PORTABLE CHEST 1 VIEW COMPARISON:  04/15/2019 FINDINGS: The heart size and mediastinal contours are within normal limits. Aortic atherosclerosis. Both lungs are clear. The visualized skeletal structures are unremarkable. IMPRESSION: No active disease. Electronically Signed   By: 14/10/2018 M.D.   On: 05/11/2019 21:01     Procedures Procedures (including critical care time)  Medications Ordered in ED Medications  albuterol (VENTOLIN HFA) 108 (90 Base) MCG/ACT inhaler 6 puff (6 puffs Inhalation Given 05/11/19 1907)  ipratropium (ATROVENT HFA) inhaler 6 puff (6 puffs Inhalation Given 05/11/19 1907)  predniSONE (DELTASONE) tablet 60 mg (60 mg Oral Given 05/11/19 2128)  fluticasone (FLOVENT HFA) 44 MCG/ACT inhaler 2 puff (2 puffs Inhalation Given 05/11/19 2058)    ED Course  I have reviewed the triage vital signs and the nursing notes.  Pertinent labs & imaging results that were available during my care of the patient were  reviewed by me and considered in my medical decision making (see chart for details).    MDM Rules/Calculators/A&P                      86yo female with history of COPD, htn, hlpd, presents with concern for shortness of breath.  Differential diagnosis for dyspnea includes ACS, PE, COPD exacerbation, CHF exacerbation, anemia, pneumonia.  Chest x-ray was done which showed no acute abnormalities. EKG was evaluated by me which showed normal sinus rhythm without acute abnormalities.  Denies CP, doubt ACS. No asymmetric swelling, hypoxia, and with improvement with treatments in ED have low suspicion for PE.  No clinical signs of CHF exacerbation. No anemia or significant electrolyte eabnormalities  Presents with diffuse wheezing, improvement with treatments.  Provided education by RT Will given albuterol, atroven inhalers to use at home as well as BID flovent given more frequent exacerbations. Discussed with grandson who is also interested in pulmonology follow up given more difficulty managing symptoms at home. Given number for pulmonology, recommend twice daily flovent which was given in ED as well as atrovent/albuterol inhalers provided.  Pt speaking in full sentences, not hypoxic, improved in ED and appropriate for outpateint follow up. Given prednisone in ED and rx for same. COVID testing sent given  cough and dyspnea. Patient discharged in stable condition with understanding of reasons to return.  Pearle Wandler was evaluated in Emergency Department on 05/13/2019 for the symptoms described in the history of present illness. She was evaluated in the context of the global COVID-19 pandemic, which necessitated consideration that the patient might be at risk for infection with the SARS-CoV-2 virus that causes COVID-19. Institutional protocols and algorithms that pertain to the evaluation of patients at risk for COVID-19 are in a state of rapid change based on information released by regulatory bodies including the CDC and federal and state organizations. These policies and algorithms were followed during the patient's care in the ED.     Final Clinical Impression(s) / ED Diagnoses Final diagnoses:  COPD exacerbation ([place])    Rx / DC Orders ED Discharge Orders         Ordered    predniSONE (DELTASONE) 10 MG tablet  Daily     05/11/19 2251    ipratropium (ATROVENT HFA) 17 MCG/ACT inhaler  Every 4 hours PRN     05/11/19 2253    fluticasone (FLOVENT HFA) 44 MCG/ACT inhaler  2 times daily     05/11/19 2253           Gareth Morgan, MD 05/13/19 1429

## 2019-05-11 NOTE — ED Triage Notes (Signed)
Pt c/o SOB x 1 week, seen here last week for same

## 2019-05-11 NOTE — ED Notes (Signed)
X-ray at bedside

## 2019-05-12 LAB — SARS CORONAVIRUS 2 (TAT 6-24 HRS): SARS Coronavirus 2: NEGATIVE

## 2019-05-28 ENCOUNTER — Other Ambulatory Visit: Payer: Self-pay

## 2019-05-28 ENCOUNTER — Encounter: Payer: Self-pay | Admitting: Internal Medicine

## 2019-05-28 ENCOUNTER — Other Ambulatory Visit: Payer: Self-pay | Admitting: General Surgery

## 2019-05-28 ENCOUNTER — Ambulatory Visit: Payer: Medicare Other | Admitting: Internal Medicine

## 2019-05-28 VITALS — BP 122/68 | HR 60 | Temp 97.3°F | Ht 61.0 in | Wt 157.2 lb

## 2019-05-28 DIAGNOSIS — J4540 Moderate persistent asthma, uncomplicated: Secondary | ICD-10-CM

## 2019-05-28 DIAGNOSIS — Z23 Encounter for immunization: Secondary | ICD-10-CM

## 2019-05-28 DIAGNOSIS — J45909 Unspecified asthma, uncomplicated: Secondary | ICD-10-CM | POA: Insufficient documentation

## 2019-05-28 LAB — CBC WITH DIFFERENTIAL/PLATELET
Basophils Absolute: 0.1 10*3/uL (ref 0.0–0.1)
Basophils Relative: 1.2 % (ref 0.0–3.0)
Eosinophils Absolute: 0.7 10*3/uL (ref 0.0–0.7)
Eosinophils Relative: 7.7 % — ABNORMAL HIGH (ref 0.0–5.0)
HCT: 39 % (ref 36.0–46.0)
Hemoglobin: 12.7 g/dL (ref 12.0–15.0)
Lymphocytes Relative: 21.4 % (ref 12.0–46.0)
Lymphs Abs: 2 10*3/uL (ref 0.7–4.0)
MCHC: 32.6 g/dL (ref 30.0–36.0)
MCV: 95.7 fl (ref 78.0–100.0)
Monocytes Absolute: 1.2 10*3/uL — ABNORMAL HIGH (ref 0.1–1.0)
Monocytes Relative: 12.8 % — ABNORMAL HIGH (ref 3.0–12.0)
Neutro Abs: 5.2 10*3/uL (ref 1.4–7.7)
Neutrophils Relative %: 56.9 % (ref 43.0–77.0)
Platelets: 258 10*3/uL (ref 150.0–400.0)
RBC: 4.07 Mil/uL (ref 3.87–5.11)
RDW: 14.4 % (ref 11.5–15.5)
WBC: 9.2 10*3/uL (ref 4.0–10.5)

## 2019-05-28 MED ORDER — PREDNISONE 20 MG PO TABS: 40.0000 mg | ORAL_TABLET | Freq: Every day | ORAL | 0 refills | Status: AC

## 2019-05-28 MED ORDER — BREO ELLIPTA 100-25 MCG/INH IN AEPB: 1.0000 | INHALATION_SPRAY | Freq: Every day | RESPIRATORY_TRACT | 0 refills | Status: DC

## 2019-05-28 MED ORDER — CETIRIZINE HCL 10 MG PO TABS: 10.0000 mg | ORAL_TABLET | Freq: Every day | ORAL | 3 refills | Status: DC

## 2019-05-28 MED ORDER — BREO ELLIPTA 100-25 MCG/INH IN AEPB: 1.0000 | INHALATION_SPRAY | Freq: Every day | RESPIRATORY_TRACT | 11 refills | Status: DC

## 2019-05-28 MED ORDER — MONTELUKAST SODIUM 10 MG PO TABS: 10.0000 mg | ORAL_TABLET | Freq: Every day | ORAL | 11 refills | Status: DC

## 2019-05-28 MED ORDER — IPRATROPIUM-ALBUTEROL 0.5-2.5 (3) MG/3ML IN SOLN: 3.0000 mL | Freq: Four times a day (QID) | RESPIRATORY_TRACT | 12 refills | Status: DC | PRN

## 2019-05-28 NOTE — Progress Notes (Signed)
Synopsis: Referred in 05/28/19 for asthma by Medicine, [facility]*  Subjective:   PATIENT ID: Lynn Knox GENDER: female DOB: 17-Mar-1925, MRN: [id number]  Chief Complaint  Patient presents with  . Consult    COPD, Shortness of Breath    HPI 86 year old woman with history of asthma presenting with recurrent ER visits for this.  Symptoms improve with steroids but seem to come back.  Eos 900 on last visit.  Current regimen is atrovent, albuterol, fluticasone HFA (SAMA, SABA, ICS).  Also notably on lisinopril.  Imaging benign.  Today is her birthday!  GERD: denies Sinus issues: yes, longstanding  Recurrent ER visits began about 4-5 months ago, no inciting illness/travel/med change we can point to.  Symptoms are nagging cough, breathlessness, and wheezing.  This is relieved pretty quickly in ER with nebulizer.  After nebulizer she starts coughing up green phlegm.  No obvious triggers other than weather change in Spring.  ROS Positive Symptoms in bold:  Constitutional fevers, chills, weight loss, fatigue, anorexia, malaise  Eyes decreased vision, double vision, eye irritation  Ears, Nose, Mouth, Throat sore throat, trouble swallowing, sinus congestion  Cardiovascular chest pain, paroxysmal nocturnal dyspnea, lower ext edema, palpitations   Respiratory SOB, cough, DOE, hemoptysis, wheezing  Gastrointestinal nausea, vomiting, diarrhea  Genitourinary burning with urination, trouble urinating  Musculoskeletal joint aches, joint swelling, back pain  Integumentary  rashes, skin lesions  Neurological focal weakness, focal numbness, trouble speaking, headaches  Psychiatric depression, anxiety, confusion  Endocrine polyuria, polydipsia, cold intolerance, heat intolerance  Hematologic abnormal bruising, abnormal bleeding, unexplained nose bleeds  Allergic/Immunologic recurrent infections, hives, swollen lymph nodes    Past Medical History:  Diagnosis Date  . Asthma   . COPD  (chronic obstructive pulmonary disease) (HCC)   . Hyperlipemia   . Hypertension   . Thyroid disease      No family history on file.   Past Surgical History:  Procedure Laterality Date  . TONSILLECTOMY      Social History   Socioeconomic History  . Marital status: Unknown    Spouse name: Not on file  . Number of children: Not on file  . Years of education: Not on file  . Highest education level: Not on file  Occupational History  . Not on file  Tobacco Use  . Smoking status: Never Smoker  . Smokeless tobacco: Never Used  Substance and Sexual Activity  . Alcohol use: Never  . Drug use: Never  . Sexual activity: Not on file  Other Topics Concern  . Not on file  Social History Narrative  . Not on file   Social Determinants of Health   Financial Resource Strain:   . Difficulty of Paying Living Expenses: Not on file  Food Insecurity:   . Worried About Programme researcher, broadcasting/film/video in the Last Year: Not on file  . Ran Out of Food in the Last Year: Not on file  Transportation Needs:   . Lack of Transportation (Medical): Not on file  . Lack of Transportation (Non-Medical): Not on file  Physical Activity:   . Days of Exercise per Week: Not on file  . Minutes of Exercise per Session: Not on file  Stress:   . Feeling of Stress : Not on file  Social Connections:   . Frequency of Communication with Friends and Family: Not on file  . Frequency of Social Gatherings with Friends and Family: Not on file  . Attends Religious Services: Not on file  . Active  Member of Clubs or Organizations: Not on file  . Attends Archivist Meetings: Not on file  . Marital Status: Not on file  Intimate Partner Violence:   . Fear of Current or Ex-Partner: Not on file  . Emotionally Abused: Not on file  . Physically Abused: Not on file  . Sexually Abused: Not on file     Allergies  Allergen Reactions  . Doxycycline     Pt states makes her eye red     Outpatient Medications Prior to  Visit  Medication Sig Dispense Refill  . albuterol (VENTOLIN HFA) 108 (90 Base) MCG/ACT inhaler Inhale 2 puffs into the lungs every 6 (six) hours as needed.    . fluticasone (FLOVENT HFA) 44 MCG/ACT inhaler Inhale 2 puffs into the lungs 2 (two) times daily. 1 Inhaler 12  . hydrochlorothiazide (HYDRODIURIL) 25 MG tablet Take 25 mg by mouth daily.    Marland Kitchen ipratropium (ATROVENT HFA) 17 MCG/ACT inhaler Inhale 2 puffs into the lungs every 4 (four) hours as needed for wheezing. 1 Inhaler 12  . levothyroxine (SYNTHROID) 25 MCG tablet Take 25 mcg by mouth daily.    Marland Kitchen lisinopril (ZESTRIL) 20 MG tablet Take 20 mg by mouth daily.    . pravastatin (PRAVACHOL) 20 MG tablet Take 20 mg by mouth daily.    Marland Kitchen azithromycin (ZITHROMAX Z-PAK) 250 MG tablet Take 2 tabs by mouth on day 1 then 1 tab by mouth daily on days 2-5 (Patient not taking: Reported on 05/28/2019) 6 tablet 0   No facility-administered medications prior to visit.     Objective:  GEN: elderly woman in NAD HEENT: MM dry, trachea midline CV: RRR, ext warm PULM: Clear bilaterally without wheezing GI: Soft, +BS EXT: No edema, arthritic changes of hands noted NEURO: Moves all 4 ext to command, hard of hearing PSYCH: Seems oriented and answering questions appropriately SKIN: No rashes    Vitals:   05/28/19 1049  BP: 122/68  Pulse: 60  Temp: (!) 97.3 F (36.3 C)  SpO2: 99%  Weight: 157 lb 3.2 oz (71.3 kg)  Height: 5\' 1"  (1.549 m)   99% on  RA BMI Readings from Last 3 Encounters:  05/28/19 29.70 kg/m  05/11/19 28.00 kg/m  04/15/19 28.32 kg/m   Wt Readings from Last 3 Encounters:  05/28/19 157 lb 3.2 oz (71.3 kg)  05/11/19 163 lb 2.3 oz (74 kg)  04/15/19 165 lb (74.8 kg)     CBC    Component Value Date/Time   WBC 8.3 05/11/2019 2129   RBC 4.30 05/11/2019 2129   HGB 13.6 05/11/2019 2129   HCT 42.2 05/11/2019 2129   PLT 281 05/11/2019 2129   MCV 98.1 05/11/2019 2129   MCH 31.6 05/11/2019 2129   MCHC 32.2 05/11/2019 2129     RDW 13.8 05/11/2019 2129   LYMPHSABS 2.0 05/11/2019 2129   MONOABS 1.1 (H) 05/11/2019 2129   EOSABS 0.9 (H) 05/11/2019 2129   BASOSABS 0.1 05/11/2019 2129   CXR looks okay, maybe slight hyperinflation    Assessment & Plan:   # Moderate persistent asthma with allergic features- needs stronger controller med and would benefit from home nebulizer.  No obvious trigger to worsening flares in past few months.  Goal is preventing ER visits and limiting systemic steroids if we can.  Discussion: - Start zyrtec, singulair - Switch home regimen to breo + PRN duonebs - Rescue steroid pack written - Check IgE, Eos, if persistent issues would switch ACE-I  To ARB  and can consider biologic - Detailed instructions given to son, he will reach out by mychart if any issues - flu shot today - 4 week telephone visit followup   Current Outpatient Medications:  .  albuterol (VENTOLIN HFA) 108 (90 Base) MCG/ACT inhaler, Inhale 2 puffs into the lungs every 6 (six) hours as needed., Disp: , Rfl:  .  fluticasone (FLOVENT HFA) 44 MCG/ACT inhaler, Inhale 2 puffs into the lungs 2 (two) times daily., Disp: 1 Inhaler, Rfl: 12 .  hydrochlorothiazide (HYDRODIURIL) 25 MG tablet, Take 25 mg by mouth daily., Disp: , Rfl:  .  ipratropium (ATROVENT HFA) 17 MCG/ACT inhaler, Inhale 2 puffs into the lungs every 4 (four) hours as needed for wheezing., Disp: 1 Inhaler, Rfl: 12 .  levothyroxine (SYNTHROID) 25 MCG tablet, Take 25 mcg by mouth daily., Disp: , Rfl:  .  lisinopril (ZESTRIL) 20 MG tablet, Take 20 mg by mouth daily., Disp: , Rfl:  .  pravastatin (PRAVACHOL) 20 MG tablet, Take 20 mg by mouth daily., Disp: , Rfl:  .  cetirizine (ZYRTEC ALLERGY) 10 MG tablet, Take 1 tablet (10 mg total) by mouth daily., Disp: 90 tablet, Rfl: 3 .  fluticasone furoate-vilanterol (BREO ELLIPTA) 100-25 MCG/INH AEPB, Inhale 1 puff into the lungs daily., Disp: 30 each, Rfl: 11 .  montelukast (SINGULAIR) 10 MG tablet, Take 1 tablet (10 mg  total) by mouth daily., Disp: 30 tablet, Rfl: 11   Lorin Glass, MD [facility] 05/28/2019 11:26 AM

## 2019-05-28 NOTE — Patient Instructions (Addendum)
-   Nebulizer: use up to 4 times daily to help with cough or breathing - Stop all inhalers except breo: take this 1 puff daily, call me if this is too expensive, we can use alternative agents that do same thing, it is hard for me to figure out what is on a particular formulary - Start zyrtec and singulair pills once daily - Get blood work today - Telephone visit in 1 month to assure doing better, send me mychart message if issue in interim  It was nice to meet you!

## 2019-05-28 NOTE — Addendum Note (Signed)
Addended by: Ander Slade on: 05/28/2019 12:01 PM   Modules accepted: Orders

## 2019-05-28 NOTE — Progress Notes (Signed)
x

## 2019-05-29 LAB — IGE: IgE (Immunoglobulin E), Serum: 217 kU/L — ABNORMAL HIGH (ref <=114)

## 2019-06-23 NOTE — Progress Notes (Signed)
Visit done by telephone due to [place] pandemic to limit exposure with son's permission.  Synopsis: Referred in 05/28/19 for asthma by Medicine, [facility]:   Moderate persistent asthma with allergic features- doing well with current regimen.  TH2 labs +. - Continue breo, zyrtec, singulair, PRN duonebs OR albuterol - 6 month f/u, told to reach out sooner if she is feeling unwell  MDM . I reviewed the result(s) of IgE elevated and 217, Eosinophils elevated at 700 Independent interpretation of tests . Review of patient's 05/11/19 CXR images revealed mild hyperinflation. The patient's images have been independently reviewed by me.    Subjective:   PATIENT ID: Lynn Knox GENDER: female DOB: Jan 10, 1925, MRN: [id number]  Asthma followup  HPI Asthma f/u with frequent ER visits.  History as below. Last visit consolidated regimen into breo, PRN albuterol or duonebs (whatever is handy), singulair, and omeprazole. 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 Overall patient doing very well on this regimen, rarely needs nebs, able to do all ADLs.  Son is happy with current level of function.   86 year old woman with history of asthma presenting with recurrent ER visits for this.  Symptoms improve with steroids but seem to come back.  Eos 900 on last visit.  Current regimen is atrovent, albuterol, fluticasone HFA (SAMA, SABA, ICS).  Also notably on lisinopril.  Imaging benign.  Today is her birthday!  GERD: denies Sinus issues: yes, longstanding  Recurrent ER visits began about 4-5 months ago, no inciting illness/travel/med change we can point to.  Symptoms are nagging cough, breathlessness, and wheezing.  This is relieved pretty quickly in ER with nebulizer.  After nebulizer she starts coughing up green phlegm.  No obvious triggers other than weather change in Spring.  ROS (per son) + symptoms in bold Fevers, chills, weight loss Nausea, vomiting,  diarrhea Shortness of breath, wheezing, cough Chest pain, palpitations, lower ext edema   Objective:  Telephone visit with son.    There were no vitals filed for this visit.   on  RA BMI Readings from Last 3 Encounters:  05/28/19 29.70 kg/m  05/11/19 28.00 kg/m  04/15/19 28.32 kg/m   Wt Readings from Last 3 Encounters:  05/28/19 157 lb 3.2 oz (71.3 kg)  05/11/19 163 lb 2.3 oz (74 kg)  04/15/19 165 lb (74.8 kg)    Ancillary Information    CBC    Component Value Date/Time   WBC 9.2 05/28/2019 1142   RBC 4.07 05/28/2019 1142   HGB 12.7 05/28/2019 1142   HCT 39.0 05/28/2019 1142   PLT 258.0 05/28/2019 1142   MCV 95.7 05/28/2019 1142   MCH 31.6 05/11/2019 2129   MCHC 32.6 05/28/2019 1142   RDW 14.4 05/28/2019 1142   LYMPHSABS 2.0 05/28/2019 1142   MONOABS 1.2 (H) 05/28/2019 1142   EOSABS 0.7 05/28/2019 1142   BASOSABS 0.1 05/28/2019 1142       Past Medical History:  Diagnosis Date  . Asthma   . COPD (chronic obstructive pulmonary disease) ([place])   . Hyperlipemia   . Hypertension   . Thyroid disease      No family history on file.   Past Surgical History:  Procedure Laterality Date  . TONSILLECTOMY      Social History   Socioeconomic History  . Marital status: Unknown    Spouse name: Not on file  . Number of children: Not on file  . Years of education: Not on file  .  Highest education level: Not on file  Occupational History  . Not on file  Tobacco Use  . Smoking status: Never Smoker  . Smokeless tobacco: Never Used  Substance and Sexual Activity  . Alcohol use: Never  . Drug use: Never  . Sexual activity: Not on file  Other Topics Concern  . Not on file  Social History Narrative  . Not on file   Social Determinants of Health   Financial Resource Strain:   . Difficulty of Paying Living Expenses: Not on file  Food Insecurity:   . Worried About Programme researcher, broadcasting/film/video in the Last Year: Not on file  . Ran Out of Food in the Last Year: Not  on file  Transportation Needs:   . Lack of Transportation (Medical): Not on file  . Lack of Transportation (Non-Medical): Not on file  Physical Activity:   . Days of Exercise per Week: Not on file  . Minutes of Exercise per Session: Not on file  Stress:   . Feeling of Stress : Not on file  Social Connections:   . Frequency of Communication with Friends and Family: Not on file  . Frequency of Social Gatherings with Friends and Family: Not on file  . Attends Religious Services: Not on file  . Active Member of Clubs or Organizations: Not on file  . Attends Banker Meetings: Not on file  . Marital Status: Not on file  Intimate Partner Violence:   . Fear of Current or Ex-Partner: Not on file  . Emotionally Abused: Not on file  . Physically Abused: Not on file  . Sexually Abused: Not on file     Allergies  Allergen Reactions  . Doxycycline     Pt states makes her eye red     Outpatient Medications Prior to Visit  Medication Sig Dispense Refill  . albuterol (VENTOLIN HFA) 108 (90 Base) MCG/ACT inhaler Inhale 2 puffs into the lungs every 6 (six) hours as needed.    . cetirizine (ZYRTEC ALLERGY) 10 MG tablet Take 1 tablet (10 mg total) by mouth daily. 90 tablet 3  . fluticasone (FLOVENT HFA) 44 MCG/ACT inhaler Inhale 2 puffs into the lungs 2 (two) times daily. 1 Inhaler 12  . fluticasone furoate-vilanterol (BREO ELLIPTA) 100-25 MCG/INH AEPB Inhale 1 puff into the lungs daily. 30 each 11  . fluticasone furoate-vilanterol (BREO ELLIPTA) 100-25 MCG/INH AEPB Inhale 1 puff into the lungs daily. 1 each 0  . hydrochlorothiazide (HYDRODIURIL) 25 MG tablet Take 25 mg by mouth daily.    Marland Kitchen ipratropium (ATROVENT HFA) 17 MCG/ACT inhaler Inhale 2 puffs into the lungs every 4 (four) hours as needed for wheezing. 1 Inhaler 12  . ipratropium-albuterol (DUONEB) 0.5-2.5 (3) MG/3ML SOLN Take 3 mLs by nebulization 4 (four) times daily as needed. 360 mL 12  . levothyroxine (SYNTHROID) 25 MCG  tablet Take 25 mcg by mouth daily.    Marland Kitchen lisinopril (ZESTRIL) 20 MG tablet Take 20 mg by mouth daily.    . montelukast (SINGULAIR) 10 MG tablet Take 1 tablet (10 mg total) by mouth daily. 30 tablet 11  . pravastatin (PRAVACHOL) 20 MG tablet Take 20 mg by mouth daily.     No facility-administered medications prior to visit.

## 2019-06-26 ENCOUNTER — Ambulatory Visit (INDEPENDENT_AMBULATORY_CARE_PROVIDER_SITE_OTHER): Payer: Medicare Other | Admitting: Internal Medicine

## 2019-06-26 ENCOUNTER — Other Ambulatory Visit: Payer: Self-pay

## 2019-06-26 DIAGNOSIS — J4540 Moderate persistent asthma, uncomplicated: Secondary | ICD-10-CM

## 2019-06-26 MED ORDER — BREO ELLIPTA 100-25 MCG/INH IN AEPB: 1.0000 | INHALATION_SPRAY | Freq: Every day | RESPIRATORY_TRACT | 11 refills | Status: DC

## 2019-06-26 NOTE — Patient Instructions (Signed)
-   Continue current regimen - 6 month followup visit, sooner if having issues

## 2020-05-05 ENCOUNTER — Other Ambulatory Visit: Payer: Self-pay | Admitting: Internal Medicine

## 2020-06-23 DIAGNOSIS — I4891 Unspecified atrial fibrillation: Secondary | ICD-10-CM | POA: Diagnosis present

## 2020-06-23 DIAGNOSIS — R32 Unspecified urinary incontinence: Secondary | ICD-10-CM | POA: Insufficient documentation

## 2020-06-23 DIAGNOSIS — R7303 Prediabetes: Secondary | ICD-10-CM | POA: Insufficient documentation

## 2020-06-23 DIAGNOSIS — H833X3 Noise effects on inner ear, bilateral: Secondary | ICD-10-CM | POA: Insufficient documentation

## 2020-07-29 ENCOUNTER — Other Ambulatory Visit: Payer: Self-pay | Admitting: Internal Medicine

## 2020-11-10 ENCOUNTER — Emergency Department (HOSPITAL_COMMUNITY): Payer: Medicare Other

## 2020-11-10 ENCOUNTER — Inpatient Hospital Stay (HOSPITAL_COMMUNITY): Payer: Medicare Other | Admitting: Certified Registered Nurse Anesthetist

## 2020-11-10 ENCOUNTER — Encounter (HOSPITAL_COMMUNITY)
Admission: EM | Disposition: A | Discharge status: Skilled Nursing Facility | Payer: Self-pay | Source: Home / Self Care | Attending: Student in an Organized Health Care Education/Training Program

## 2020-11-10 ENCOUNTER — Inpatient Hospital Stay (HOSPITAL_COMMUNITY)
Admission: EM | Admit: 2020-11-10 | Discharge: 2020-11-13 | DRG: 481 | Disposition: A | Discharge status: Skilled Nursing Facility | Payer: Medicare Other | Attending: Student in an Organized Health Care Education/Training Program | Admitting: Student in an Organized Health Care Education/Training Program

## 2020-11-10 ENCOUNTER — Encounter (HOSPITAL_COMMUNITY): Payer: Self-pay | Admitting: Student in an Organized Health Care Education/Training Program

## 2020-11-10 ENCOUNTER — Inpatient Hospital Stay (HOSPITAL_COMMUNITY): Payer: Medicare Other

## 2020-11-10 ENCOUNTER — Other Ambulatory Visit: Payer: Self-pay

## 2020-11-10 DIAGNOSIS — E785 Hyperlipidemia, unspecified: Secondary | ICD-10-CM | POA: Diagnosis not present

## 2020-11-10 DIAGNOSIS — I4891 Unspecified atrial fibrillation: Secondary | ICD-10-CM | POA: Diagnosis not present

## 2020-11-10 DIAGNOSIS — S72112A Displaced fracture of greater trochanter of left femur, initial encounter for closed fracture: Secondary | ICD-10-CM | POA: Diagnosis present

## 2020-11-10 DIAGNOSIS — Y92009 Unspecified place in unspecified non-institutional (private) residence as the place of occurrence of the external cause: Secondary | ICD-10-CM | POA: Diagnosis not present

## 2020-11-10 DIAGNOSIS — S72102A Unspecified trochanteric fracture of left femur, initial encounter for closed fracture: Secondary | ICD-10-CM

## 2020-11-10 DIAGNOSIS — I10 Essential (primary) hypertension: Secondary | ICD-10-CM | POA: Diagnosis not present

## 2020-11-10 DIAGNOSIS — Z79899 Other long term (current) drug therapy: Secondary | ICD-10-CM

## 2020-11-10 DIAGNOSIS — J449 Chronic obstructive pulmonary disease, unspecified: Secondary | ICD-10-CM | POA: Diagnosis not present

## 2020-11-10 DIAGNOSIS — Z419 Encounter for procedure for purposes other than remedying health state, unspecified: Secondary | ICD-10-CM

## 2020-11-10 DIAGNOSIS — R262 Difficulty in walking, not elsewhere classified: Secondary | ICD-10-CM | POA: Diagnosis present

## 2020-11-10 DIAGNOSIS — W010XXA Fall on same level from slipping, tripping and stumbling without subsequent striking against object, initial encounter: Secondary | ICD-10-CM | POA: Diagnosis present

## 2020-11-10 DIAGNOSIS — Z79890 Hormone replacement therapy (postmenopausal): Secondary | ICD-10-CM

## 2020-11-10 DIAGNOSIS — W19XXXA Unspecified fall, initial encounter: Secondary | ICD-10-CM

## 2020-11-10 DIAGNOSIS — M25552 Pain in left hip: Secondary | ICD-10-CM

## 2020-11-10 DIAGNOSIS — E039 Hypothyroidism, unspecified: Secondary | ICD-10-CM | POA: Diagnosis present

## 2020-11-10 DIAGNOSIS — S72009A Fracture of unspecified part of neck of unspecified femur, initial encounter for closed fracture: Secondary | ICD-10-CM | POA: Diagnosis present

## 2020-11-10 DIAGNOSIS — S72142A Displaced intertrochanteric fracture of left femur, initial encounter for closed fracture: Secondary | ICD-10-CM

## 2020-11-10 DIAGNOSIS — D62 Acute posthemorrhagic anemia: Secondary | ICD-10-CM | POA: Diagnosis not present

## 2020-11-10 DIAGNOSIS — Z20822 Contact with and (suspected) exposure to covid-19: Secondary | ICD-10-CM | POA: Diagnosis present

## 2020-11-10 DIAGNOSIS — Y9301 Activity, walking, marching and hiking: Secondary | ICD-10-CM | POA: Diagnosis present

## 2020-11-10 DIAGNOSIS — Z888 Allergy status to other drugs, medicaments and biological substances status: Secondary | ICD-10-CM | POA: Diagnosis not present

## 2020-11-10 HISTORY — PX: INTRAMEDULLARY (IM) NAIL INTERTROCHANTERIC: SHX5875

## 2020-11-10 LAB — URINALYSIS, ROUTINE W REFLEX MICROSCOPIC
Bilirubin Urine: NEGATIVE
Glucose, UA: NEGATIVE mg/dL
Ketones, ur: NEGATIVE mg/dL
Nitrite: NEGATIVE
Protein, ur: NEGATIVE mg/dL
Specific Gravity, Urine: 1.016 (ref 1.005–1.030)
pH: 6 (ref 5.0–8.0)

## 2020-11-10 LAB — COMPREHENSIVE METABOLIC PANEL
ALT: 39 U/L (ref 0–44)
AST: 49 U/L — ABNORMAL HIGH (ref 15–41)
Albumin: 3.2 g/dL — ABNORMAL LOW (ref 3.5–5.0)
Alkaline Phosphatase: 77 U/L (ref 38–126)
Anion gap: 9 (ref 5–15)
BUN: 23 mg/dL (ref 8–23)
CO2: 26 mmol/L (ref 22–32)
Calcium: 9 mg/dL (ref 8.9–10.3)
Chloride: 105 mmol/L (ref 98–111)
Creatinine, Ser: 1.07 mg/dL — ABNORMAL HIGH (ref 0.44–1.00)
GFR, Estimated: 48 mL/min — ABNORMAL LOW (ref >60)
Glucose, Bld: 110 mg/dL — ABNORMAL HIGH (ref 70–99)
Potassium: 3.5 mmol/L (ref 3.5–5.1)
Sodium: 140 mmol/L (ref 135–145)
Total Bilirubin: 1.8 mg/dL — ABNORMAL HIGH (ref 0.3–1.2)
Total Protein: 6.6 g/dL (ref 6.5–8.1)

## 2020-11-10 LAB — CBC WITH DIFFERENTIAL/PLATELET
Abs Immature Granulocytes: 0.03 10*3/uL (ref 0.00–0.07)
Basophils Absolute: 0.1 10*3/uL (ref 0.0–0.1)
Basophils Relative: 1 %
Eosinophils Absolute: 0.4 10*3/uL (ref 0.0–0.5)
Eosinophils Relative: 5 %
HCT: 36.4 % (ref 36.0–46.0)
Hemoglobin: 11.9 g/dL — ABNORMAL LOW (ref 12.0–15.0)
Immature Granulocytes: 0 %
Lymphocytes Relative: 22 %
Lymphs Abs: 1.9 10*3/uL (ref 0.7–4.0)
MCH: 32.1 pg (ref 26.0–34.0)
MCHC: 32.7 g/dL (ref 30.0–36.0)
MCV: 98.1 fL (ref 80.0–100.0)
Monocytes Absolute: 1.2 10*3/uL — ABNORMAL HIGH (ref 0.1–1.0)
Monocytes Relative: 14 %
Neutro Abs: 4.8 10*3/uL (ref 1.7–7.7)
Neutrophils Relative %: 58 %
Platelets: 249 10*3/uL (ref 150–400)
RBC: 3.71 MIL/uL — ABNORMAL LOW (ref 3.87–5.11)
RDW: 14.3 % (ref 11.5–15.5)
WBC: 8.5 10*3/uL (ref 4.0–10.5)
nRBC: 0 % (ref 0.0–0.2)

## 2020-11-10 LAB — CBC
HCT: 31.1 % — ABNORMAL LOW (ref 36.0–46.0)
Hemoglobin: 10.3 g/dL — ABNORMAL LOW (ref 12.0–15.0)
MCH: 32 pg (ref 26.0–34.0)
MCHC: 33.1 g/dL (ref 30.0–36.0)
MCV: 96.6 fL (ref 80.0–100.0)
Platelets: 241 10*3/uL (ref 150–400)
RBC: 3.22 MIL/uL — ABNORMAL LOW (ref 3.87–5.11)
RDW: 14.3 % (ref 11.5–15.5)
WBC: 12.6 10*3/uL — ABNORMAL HIGH (ref 4.0–10.5)
nRBC: 0 % (ref 0.0–0.2)

## 2020-11-10 LAB — SURGICAL PCR SCREEN
MRSA, PCR: POSITIVE — AB
Staphylococcus aureus: POSITIVE — AB

## 2020-11-10 LAB — RESP PANEL BY RT-PCR (FLU A&B, COVID) ARPGX2
Influenza A by PCR: NEGATIVE
Influenza B by PCR: NEGATIVE
SARS Coronavirus 2 by RT PCR: NEGATIVE

## 2020-11-10 LAB — CREATININE, SERUM
Creatinine, Ser: 0.95 mg/dL (ref 0.44–1.00)
GFR, Estimated: 56 mL/min — ABNORMAL LOW (ref >60)

## 2020-11-10 LAB — TSH: TSH: 1.853 u[IU]/mL (ref 0.350–4.500)

## 2020-11-10 SURGERY — FIXATION, FRACTURE, INTERTROCHANTERIC, WITH INTRAMEDULLARY ROD
Anesthesia: General | Laterality: Left

## 2020-11-10 MED ORDER — SENNOSIDES-DOCUSATE SODIUM 8.6-50 MG PO TABS: 1.0000 | ORAL_TABLET | Freq: Every evening | ORAL | Status: DC | PRN

## 2020-11-10 MED ORDER — PHENOL 1.4 % MT LIQD: 1.0000 | OROMUCOSAL | Status: DC | PRN

## 2020-11-10 MED ORDER — MONTELUKAST SODIUM 10 MG PO TABS
5.0000 mg | ORAL_TABLET | Freq: Every day | ORAL | Status: DC
  Administered 2020-11-10 – 2020-11-12 (×3): 5 mg via ORAL
  Filled 2020-11-10 (×2): qty 1
  Filled 2020-11-10: qty 0.5
  Filled 2020-11-10: qty 1

## 2020-11-10 MED ORDER — CHLORHEXIDINE GLUCONATE 0.12 % MT SOLN
OROMUCOSAL | Status: AC
  Administered 2020-11-10: 15 mL
  Filled 2020-11-10: qty 15

## 2020-11-10 MED ORDER — ONDANSETRON HCL 4 MG/2ML IJ SOLN
INTRAMUSCULAR | Status: AC
  Filled 2020-11-10: qty 2

## 2020-11-10 MED ORDER — ONDANSETRON HCL 4 MG/2ML IJ SOLN: 4.0000 mg | Freq: Four times a day (QID) | INTRAMUSCULAR | Status: DC | PRN

## 2020-11-10 MED ORDER — FENTANYL CITRATE (PF) 250 MCG/5ML IJ SOLN
INTRAMUSCULAR | Status: AC
  Filled 2020-11-10: qty 5

## 2020-11-10 MED ORDER — CEFAZOLIN SODIUM-DEXTROSE 2-4 GM/100ML-% IV SOLN
2.0000 g | Freq: Four times a day (QID) | INTRAVENOUS | Status: AC
  Administered 2020-11-10 – 2020-11-11 (×2): 2 g via INTRAVENOUS
  Filled 2020-11-10 (×2): qty 100

## 2020-11-10 MED ORDER — METOCLOPRAMIDE HCL 5 MG/ML IJ SOLN: 5.0000 mg | Freq: Three times a day (TID) | INTRAMUSCULAR | Status: DC | PRN

## 2020-11-10 MED ORDER — ONDANSETRON HCL 4 MG/2ML IJ SOLN
INTRAMUSCULAR | Status: DC | PRN
  Administered 2020-11-10: 4 mg via INTRAVENOUS

## 2020-11-10 MED ORDER — MENTHOL 3 MG MT LOZG: 1.0000 | LOZENGE | OROMUCOSAL | Status: DC | PRN

## 2020-11-10 MED ORDER — ALBUTEROL SULFATE (2.5 MG/3ML) 0.083% IN NEBU: 2.5000 mg | INHALATION_SOLUTION | Freq: Four times a day (QID) | RESPIRATORY_TRACT | Status: DC | PRN

## 2020-11-10 MED ORDER — MAGNESIUM CITRATE PO SOLN: 1.0000 | Freq: Once | ORAL | Status: DC | PRN

## 2020-11-10 MED ORDER — PRAVASTATIN SODIUM 10 MG PO TABS
20.0000 mg | ORAL_TABLET | Freq: Every day | ORAL | Status: DC
  Administered 2020-11-11 – 2020-11-13 (×3): 20 mg via ORAL
  Filled 2020-11-10 (×3): qty 2

## 2020-11-10 MED ORDER — SENNA 8.6 MG PO TABS
1.0000 | ORAL_TABLET | Freq: Two times a day (BID) | ORAL | Status: DC
  Administered 2020-11-10 – 2020-11-12 (×5): 8.6 mg via ORAL
  Filled 2020-11-10 (×5): qty 1

## 2020-11-10 MED ORDER — HYDROCODONE-ACETAMINOPHEN 5-325 MG PO TABS
1.0000 | ORAL_TABLET | ORAL | Status: DC | PRN
  Administered 2020-11-10 (×2): 1 via ORAL
  Administered 2020-11-11: 2 via ORAL
  Administered 2020-11-11: 1 via ORAL
  Administered 2020-11-12: 2 via ORAL
  Filled 2020-11-10 (×2): qty 1
  Filled 2020-11-10 (×2): qty 2
  Filled 2020-11-10: qty 1

## 2020-11-10 MED ORDER — ASPIRIN EC 81 MG PO TBEC
81.0000 mg | DELAYED_RELEASE_TABLET | Freq: Every day | ORAL | Status: DC
  Administered 2020-11-10 – 2020-11-11 (×2): 81 mg via ORAL
  Filled 2020-11-10 (×2): qty 1

## 2020-11-10 MED ORDER — CEFAZOLIN SODIUM-DEXTROSE 2-4 GM/100ML-% IV SOLN
2.0000 g | INTRAVENOUS | Status: DC
  Filled 2020-11-10: qty 100

## 2020-11-10 MED ORDER — LIDOCAINE 2% (20 MG/ML) 5 ML SYRINGE
INTRAMUSCULAR | Status: AC
  Filled 2020-11-10: qty 5

## 2020-11-10 MED ORDER — CEFAZOLIN SODIUM-DEXTROSE 2-3 GM-%(50ML) IV SOLR
INTRAVENOUS | Status: DC | PRN
  Administered 2020-11-10: 2 g via INTRAVENOUS

## 2020-11-10 MED ORDER — 0.9 % SODIUM CHLORIDE (POUR BTL) OPTIME
TOPICAL | Status: DC | PRN
  Administered 2020-11-10: 1000 mL

## 2020-11-10 MED ORDER — LIDOCAINE 2% (20 MG/ML) 5 ML SYRINGE
INTRAMUSCULAR | Status: DC | PRN
  Administered 2020-11-10: 80 mg via INTRAVENOUS

## 2020-11-10 MED ORDER — PHENYLEPHRINE 40 MCG/ML (10ML) SYRINGE FOR IV PUSH (FOR BLOOD PRESSURE SUPPORT)
PREFILLED_SYRINGE | INTRAVENOUS | Status: DC | PRN
  Administered 2020-11-10: 120 ug via INTRAVENOUS
  Administered 2020-11-10: 80 ug via INTRAVENOUS

## 2020-11-10 MED ORDER — FENTANYL CITRATE (PF) 100 MCG/2ML IJ SOLN: 25.0000 ug | INTRAMUSCULAR | Status: DC | PRN

## 2020-11-10 MED ORDER — METOCLOPRAMIDE HCL 5 MG PO TABS: 5.0000 mg | ORAL_TABLET | Freq: Three times a day (TID) | ORAL | Status: DC | PRN

## 2020-11-10 MED ORDER — ACETAMINOPHEN 325 MG PO TABS: 650.0000 mg | ORAL_TABLET | Freq: Four times a day (QID) | ORAL | Status: DC | PRN

## 2020-11-10 MED ORDER — FENTANYL CITRATE (PF) 100 MCG/2ML IJ SOLN
INTRAMUSCULAR | Status: AC
  Administered 2020-11-10: 25 ug via INTRAVENOUS
  Filled 2020-11-10: qty 2

## 2020-11-10 MED ORDER — ACETAMINOPHEN 325 MG PO TABS
325.0000 mg | ORAL_TABLET | Freq: Four times a day (QID) | ORAL | Status: DC | PRN
  Administered 2020-11-12: 650 mg via ORAL
  Filled 2020-11-10: qty 2

## 2020-11-10 MED ORDER — ENOXAPARIN SODIUM 40 MG/0.4ML IJ SOSY
40.0000 mg | PREFILLED_SYRINGE | INTRAMUSCULAR | Status: DC
  Administered 2020-11-11: 40 mg via SUBCUTANEOUS
  Filled 2020-11-10: qty 0.4

## 2020-11-10 MED ORDER — POVIDONE-IODINE 10 % EX SWAB
2.0000 "application " | Freq: Once | CUTANEOUS | Status: AC
  Administered 2020-11-10: 2 via TOPICAL

## 2020-11-10 MED ORDER — POLYETHYLENE GLYCOL 3350 17 G PO PACK: 17.0000 g | PACK | Freq: Every day | ORAL | Status: DC | PRN

## 2020-11-10 MED ORDER — PROPOFOL 10 MG/ML IV BOLUS
INTRAVENOUS | Status: AC
  Filled 2020-11-10: qty 20

## 2020-11-10 MED ORDER — HYDROCODONE-ACETAMINOPHEN 7.5-325 MG PO TABS
1.0000 | ORAL_TABLET | ORAL | Status: DC | PRN
Start: 2020-11-10 — End: 2020-11-13
  Administered 2020-11-13: 1 via ORAL
  Filled 2020-11-10: qty 1

## 2020-11-10 MED ORDER — ONDANSETRON HCL 4 MG PO TABS: 4.0000 mg | ORAL_TABLET | Freq: Four times a day (QID) | ORAL | Status: DC | PRN

## 2020-11-10 MED ORDER — TRANEXAMIC ACID-NACL 1000-0.7 MG/100ML-% IV SOLN
1000.0000 mg | Freq: Once | INTRAVENOUS | Status: AC
  Administered 2020-11-10: 1000 mg via INTRAVENOUS
  Filled 2020-11-10: qty 100

## 2020-11-10 MED ORDER — SUCCINYLCHOLINE CHLORIDE 200 MG/10ML IV SOSY
PREFILLED_SYRINGE | INTRAVENOUS | Status: AC
  Filled 2020-11-10: qty 10

## 2020-11-10 MED ORDER — CHLORHEXIDINE GLUCONATE 0.12 % MT SOLN: 15.0000 mL | Freq: Once | OROMUCOSAL | Status: AC

## 2020-11-10 MED ORDER — FLUTICASONE FUROATE-VILANTEROL 100-25 MCG/INH IN AEPB
1.0000 | INHALATION_SPRAY | Freq: Every day | RESPIRATORY_TRACT | Status: DC
  Administered 2020-11-11 – 2020-11-13 (×3): 1 via RESPIRATORY_TRACT
  Filled 2020-11-10: qty 28

## 2020-11-10 MED ORDER — ACETAMINOPHEN 650 MG RE SUPP: 650.0000 mg | Freq: Four times a day (QID) | RECTAL | Status: DC | PRN

## 2020-11-10 MED ORDER — ROCURONIUM BROMIDE 10 MG/ML (PF) SYRINGE
PREFILLED_SYRINGE | INTRAVENOUS | Status: AC
  Filled 2020-11-10: qty 10

## 2020-11-10 MED ORDER — METHOCARBAMOL 500 MG PO TABS: 500.0000 mg | ORAL_TABLET | Freq: Four times a day (QID) | ORAL | Status: DC | PRN

## 2020-11-10 MED ORDER — ACETAMINOPHEN 325 MG PO TABS
650.0000 mg | ORAL_TABLET | Freq: Once | ORAL | Status: AC
  Administered 2020-11-10: 650 mg via ORAL
  Filled 2020-11-10: qty 2

## 2020-11-10 MED ORDER — MORPHINE SULFATE (PF) 2 MG/ML IV SOLN: 0.5000 mg | INTRAVENOUS | Status: DC | PRN

## 2020-11-10 MED ORDER — SORBITOL 70 % SOLN: 30.0000 mL | Freq: Every day | Status: DC | PRN

## 2020-11-10 MED ORDER — FENTANYL CITRATE (PF) 250 MCG/5ML IJ SOLN
INTRAMUSCULAR | Status: DC | PRN
  Administered 2020-11-10: 100 ug via INTRAVENOUS
  Administered 2020-11-10: 50 ug via INTRAVENOUS

## 2020-11-10 MED ORDER — LACTATED RINGERS IV SOLN: INTRAVENOUS | Status: DC

## 2020-11-10 MED ORDER — DOCUSATE SODIUM 100 MG PO CAPS
100.0000 mg | ORAL_CAPSULE | Freq: Two times a day (BID) | ORAL | Status: DC
  Administered 2020-11-10 – 2020-11-12 (×5): 100 mg via ORAL
  Filled 2020-11-10 (×5): qty 1

## 2020-11-10 MED ORDER — LEVOTHYROXINE SODIUM 25 MCG PO TABS
25.0000 ug | ORAL_TABLET | Freq: Every day | ORAL | Status: DC
  Administered 2020-11-11 – 2020-11-13 (×3): 25 ug via ORAL
  Filled 2020-11-10 (×3): qty 1

## 2020-11-10 MED ORDER — METHOCARBAMOL 1000 MG/10ML IJ SOLN
500.0000 mg | Freq: Four times a day (QID) | INTRAMUSCULAR | Status: DC | PRN
  Filled 2020-11-10: qty 5

## 2020-11-10 MED ORDER — PROPOFOL 10 MG/ML IV BOLUS
INTRAVENOUS | Status: DC | PRN
  Administered 2020-11-10: 120 mg via INTRAVENOUS

## 2020-11-10 MED ORDER — ROCURONIUM BROMIDE 10 MG/ML (PF) SYRINGE
PREFILLED_SYRINGE | INTRAVENOUS | Status: DC | PRN
  Administered 2020-11-10: 40 mg via INTRAVENOUS

## 2020-11-10 MED ORDER — TRANEXAMIC ACID-NACL 1000-0.7 MG/100ML-% IV SOLN
1000.0000 mg | INTRAVENOUS | Status: AC
  Administered 2020-11-10: 1000 mg via INTRAVENOUS
  Filled 2020-11-10: qty 100

## 2020-11-10 MED ORDER — CHLORHEXIDINE GLUCONATE 4 % EX LIQD
60.0000 mL | Freq: Once | CUTANEOUS | Status: DC
  Filled 2020-11-10: qty 60

## 2020-11-10 MED ORDER — SUGAMMADEX SODIUM 200 MG/2ML IV SOLN
INTRAVENOUS | Status: DC | PRN
  Administered 2020-11-10: 350 mg via INTRAVENOUS

## 2020-11-10 MED ORDER — ORAL CARE MOUTH RINSE
15.0000 mL | Freq: Once | OROMUCOSAL | Status: AC
Start: 2020-11-10 — End: 2020-11-10

## 2020-11-10 SURGICAL SUPPLY — 51 items
ALCOHOL 70% 16 OZ (MISCELLANEOUS) ×2 IMPLANT
BAG COUNTER SPONGE SURGICOUNT (BAG) ×2 IMPLANT
BIT DRILL CANN LG 4.3MM (BIT) ×1 IMPLANT
BNDG COHESIVE 4X5 TAN STRL (GAUZE/BANDAGES/DRESSINGS) IMPLANT
CHLORAPREP W/TINT 26 (MISCELLANEOUS) ×2 IMPLANT
COVER PERINEAL POST (MISCELLANEOUS) ×2 IMPLANT
COVER SURGICAL LIGHT HANDLE (MISCELLANEOUS) ×2 IMPLANT
DERMABOND ADVANCED (GAUZE/BANDAGES/DRESSINGS) ×1
DERMABOND ADVANCED .7 DNX12 (GAUZE/BANDAGES/DRESSINGS) ×1 IMPLANT
DRAPE C-ARM 42X72 X-RAY (DRAPES) ×2 IMPLANT
DRAPE C-ARMOR (DRAPES) ×2 IMPLANT
DRAPE HALF SHEET 40X57 (DRAPES) ×2 IMPLANT
DRAPE IMP U-DRAPE 54X76 (DRAPES) ×4 IMPLANT
DRAPE STERI IOBAN 125X83 (DRAPES) ×2 IMPLANT
DRAPE U-SHAPE 47X51 STRL (DRAPES) ×4 IMPLANT
DRILL BIT CANN LG 4.3MM (BIT) ×2
DRSG AQUACEL AG ADV 3.5X 4 (GAUZE/BANDAGES/DRESSINGS) ×2 IMPLANT
DRSG AQUACEL AG ADV 3.5X 6 (GAUZE/BANDAGES/DRESSINGS) ×2 IMPLANT
DRSG MEPILEX BORDER 4X4 (GAUZE/BANDAGES/DRESSINGS) ×4 IMPLANT
DRSG PAD ABDOMINAL 8X10 ST (GAUZE/BANDAGES/DRESSINGS) IMPLANT
ELECT REM PT RETURN 9FT ADLT (ELECTROSURGICAL) ×2
ELECTRODE REM PT RTRN 9FT ADLT (ELECTROSURGICAL) ×1 IMPLANT
FACESHIELD WRAPAROUND (MASK) ×2 IMPLANT
GLOVE SURG ENC MOIS LTX SZ8.5 (GLOVE) ×4 IMPLANT
GLOVE SURG ENC TEXT LTX SZ7 (GLOVE) ×2 IMPLANT
GLOVE SURG UNDER POLY LF SZ7.5 (GLOVE) ×2 IMPLANT
GLOVE SURG UNDER POLY LF SZ8.5 (GLOVE) ×2 IMPLANT
GOWN STRL REUS W/ TWL LRG LVL3 (GOWN DISPOSABLE) ×2 IMPLANT
GOWN STRL REUS W/ TWL XL LVL3 (GOWN DISPOSABLE) ×1 IMPLANT
GOWN STRL REUS W/TWL 2XL LVL3 (GOWN DISPOSABLE) ×2 IMPLANT
GOWN STRL REUS W/TWL LRG LVL3 (GOWN DISPOSABLE) ×4
GOWN STRL REUS W/TWL XL LVL3 (GOWN DISPOSABLE) ×2
GUIDEPIN 3.2X17.5 THRD DISP (PIN) ×4 IMPLANT
HFN 125 DEG 9MM X 180MM (Nail) ×2 IMPLANT
KIT TURNOVER KIT B (KITS) ×2 IMPLANT
MANIFOLD NEPTUNE II (INSTRUMENTS) ×2 IMPLANT
MARKER SKIN DUAL TIP RULER LAB (MISCELLANEOUS) ×2 IMPLANT
NS IRRIG 1000ML POUR BTL (IV SOLUTION) ×2 IMPLANT
PACK GENERAL/GYN (CUSTOM PROCEDURE TRAY) ×2 IMPLANT
PACK UNIVERSAL I (CUSTOM PROCEDURE TRAY) ×2 IMPLANT
PAD ARMBOARD 7.5X6 YLW CONV (MISCELLANEOUS) ×4 IMPLANT
PADDING CAST ABS 4INX4YD NS (CAST SUPPLIES)
PADDING CAST ABS COTTON 4X4 ST (CAST SUPPLIES) IMPLANT
SCREW BONE CORTICAL 5.0X32 (Screw) ×2 IMPLANT
SCREW LAG HIP NAIL 10.5X95 (Screw) ×2 IMPLANT
SUT MNCRL AB 3-0 PS2 27 (SUTURE) ×2 IMPLANT
SUT MON AB 2-0 CT1 27 (SUTURE) ×2 IMPLANT
SUT VIC AB 1 CT1 27 (SUTURE) ×2
SUT VIC AB 1 CT1 27XBRD ANBCTR (SUTURE) ×1 IMPLANT
TOWEL GREEN STERILE (TOWEL DISPOSABLE) ×2 IMPLANT
TOWEL GREEN STERILE FF (TOWEL DISPOSABLE) ×2 IMPLANT

## 2020-11-10 NOTE — Progress Notes (Addendum)
Lynn Knox is a 87 y.o. female patient admitted. Awake, alert - oriented  X 4 - no acute distress noted.  VSS - Blood pressure 132/64, pulse 67, temperature (!) 97.5 F (36.4 C), temperature source Oral, resp. rate 16, weight 74.4 kg, SpO2 99 %.    IV in place, occlusive dsg intact without redness.  Orientation to room, and floor completed.  Admission INP armband ID verified with patient/family, and in place.   SR up x 2, fall assessment complete, with patient and family able to verbalize understanding of risk associated with falls, and verbalized understanding to call nsg before up out of bed.  Call light within reach, patient able to voice, and demonstrate understanding. No evidence of skin break down noted on exam.  Orlena Sheldon at bedside.      Will cont to eval and treat per MD orders.  Fredderick Phenix, RN 11/10/2020 6:41 PM

## 2020-11-10 NOTE — Anesthesia Preprocedure Evaluation (Addendum)
Anesthesia Evaluation  Patient identified by MRN, date of birth, ID band Patient awake    Reviewed: Allergy & Precautions, NPO status , Patient's Chart, lab work & pertinent test results  Airway Mallampati: II  TM Distance: >3 FB     Dental   Pulmonary asthma , COPD,    breath sounds clear to auscultation       Cardiovascular hypertension,  Rhythm:Regular Rate:Normal     Neuro/Psych    GI/Hepatic negative GI ROS, Neg liver ROS,   Endo/Other  negative endocrine ROS  Renal/GU negative Renal ROS     Musculoskeletal   Abdominal   Peds  Hematology   Anesthesia Other Findings   Reproductive/Obstetrics                             Anesthesia Physical Anesthesia Plan  ASA: 3  Anesthesia Plan: General   Post-op Pain Management:    Induction: Intravenous  PONV Risk Score and Plan: 3 and Ondansetron  Airway Management Planned: Oral ETT  Additional Equipment:   Intra-op Plan:   Post-operative Plan: Possible Post-op intubation/ventilation  Informed Consent: I have reviewed the patients History and Physical, chart, labs and discussed the procedure including the risks, benefits and alternatives for the proposed anesthesia with the patient or authorized representative who has indicated his/her understanding and acceptance.     Dental advisory given  Plan Discussed with: CRNA and Anesthesiologist  Anesthesia Plan Comments:        Anesthesia Quick Evaluation

## 2020-11-10 NOTE — ED Notes (Signed)
Patient transported to X-ray 

## 2020-11-10 NOTE — ED Notes (Signed)
Orthopedics at bedside 

## 2020-11-10 NOTE — Discharge Instructions (Addendum)
Lynn Knox You were recently seen at [facility] for a hip fracture. You had a surgical repair with orthopedic surgery. As a result, you became anemic and required a blood transfusion. You should continue to work with physical therapy to continue to get stronger.  You were also started on a few new medications to help treat your atrial fibrillation and prevent you from developing a blood clot or stroke. You should continue to take your old medications with the follow changes  Stop taking aspirin Start taking Eliquis 2.5 mg twice a day Stop taking lisinopril   We recommend you follow up with your primary care provider in about one week.       Dr. Samson FredericBrian Swinteck Adult Hip [facility] Knee Specialist [facility] [address] [place], [place] [address] [phone]   POSTOPERATIVE DIRECTIONS    Hip Rehabilitation, Guidelines Following Surgery   WEIGHT BEARING Weight bearing as tolerated with assist device (walker, cane, etc) as directed, use it as long as suggested by your surgeon or therapist, typically at least 4-6 weeks.   HOME CARE INSTRUCTIONS  Remove items at home which could result in a fall. This includes throw rugs or furniture in walking pathways.  Continue medications as instructed at time of discharge. You may have some home medications which will be placed on hold until you complete the course of blood thinner medication. 4 days after discharge, you may start showering. No tub baths or soaking your incisions. Do not put on socks or shoes without following the instructions of your caregivers.   Sit on chairs with arms. Use the chair arms to help push yourself up when arising.  Arrange for the use of a toilet seat elevator so you are not sitting low.  Walk with walker as instructed.  You may resume a sexual relationship in one month or when given the OK by your caregiver.  Use walker as long as suggested by your caregivers.  Avoid periods of  inactivity such as sitting longer than an hour when not asleep. This helps prevent blood clots.  You may return to work once you are cleared by Designer, industrial/productyour surgeon.  Do not drive a car for 6 weeks or until released by your surgeon.  Do not drive while taking narcotics.  Wear elastic stockings for two weeks following surgery during the day but you may remove then at night.  Make sure you keep all of your appointments after your operation with all of your doctors and caregivers. You should call the office at the above phone number and make an appointment for approximately two weeks after the date of your surgery. Please pick up a stool softener and laxative for home use as long as you are requiring pain medications. ICE to the affected hip every three hours for 30 minutes at a time and then as needed for pain and swelling. Continue to use ice on the hip for pain and swelling from surgery. You may notice swelling that will progress down to the foot and ankle.  This is normal after surgery.  Elevate the leg when you are not up walking on it.   It is important for you to complete the blood thinner medication as prescribed by your doctor. Continue to use the breathing machine which will help keep your temperature down.  It is common for your temperature to cycle up and down following surgery, especially at night when you are not up moving around and exerting yourself.  The breathing  machine keeps your lungs expanded and your temperature down.  RANGE OF MOTION AND STRENGTHENING EXERCISES  These exercises are designed to help you keep full movement of your hip joint. Follow your caregiver's or physical therapist's instructions. Perform all exercises about fifteen times, three times per day or as directed. Exercise both hips, even if you have had only one joint replacement. These exercises can be done on a training (exercise) mat, on the floor, on a table or on a bed. Use whatever works the best and is most comfortable  for you. Use music or television while you are exercising so that the exercises are a pleasant break in your day. This will make your life better with the exercises acting as a break in routine you can look forward to.  Lying on your back, slowly slide your foot toward your buttocks, raising your knee up off the floor. Then slowly slide your foot back down until your leg is straight again.  Lying on your back spread your legs as far apart as you can without causing discomfort.  Lying on your side, raise your upper leg and foot straight up from the floor as far as is comfortable. Slowly lower the leg and repeat.  Lying on your back, tighten up the muscle in the front of your thigh (quadriceps muscles). You can do this by keeping your leg straight and trying to raise your heel off the floor. This helps strengthen the largest muscle supporting your knee.  Lying on your back, tighten up the muscles of your buttocks both with the legs straight and with the knee bent at a comfortable angle while keeping your heel on the floor.   SKILLED REHAB INSTRUCTIONS: If the patient is transferred to a skilled rehab facility following release from the hospital, a list of the current medications will be sent to the facility for the patient to continue.  When discharged from the skilled rehab facility, please have the facility set up the patient's Home Health Physical Therapy prior to being released. Also, the skilled facility will be responsible for providing the patient with their medications at time of release from the facility to include their pain medication and their blood thinner medication. If the patient is still at the rehab facility at time of the two week follow up appointment, the skilled rehab facility will also need to assist the patient in arranging follow up appointment in our office and any transportation needs.  MAKE SURE YOU:  Understand these instructions.  Will watch your condition.  Will get help  right away if you are not doing well or get worse.  Pick up stool softner and laxative for home use following surgery while on pain medications. Daily dry dressing changes as needed. In 4 days, you may remove your dressings and begin taking showers - no tub baths or soaking the incisions. Continue to use ice for pain and swelling after surgery. Do not use any lotions or creams on the incision until instructed by your surgeon  ====================================================================  Information on my medicine - ELIQUIS (apixaban)  This medication education was reviewed with me or my healthcare representative as part of my discharge preparation.    Why was Eliquis prescribed for you? Eliquis was prescribed for you to reduce the risk of a blood clot forming that can cause a stroke if you have a medical condition called atrial fibrillation (a type of irregular heartbeat).  What do You need to know about Eliquis ? Take your Eliquis TWICE  DAILY - one tablet in the morning and one tablet in the evening with or without food. If you have difficulty swallowing the tablet whole please discuss with your pharmacist how to take the medication safely.  Take Eliquis exactly as prescribed by your doctor and DO NOT stop taking Eliquis without talking to the doctor who prescribed the medication.  Stopping may increase your risk of developing a stroke.  Refill your prescription before you run out.  After discharge, you should have regular check-up appointments with your healthcare provider that is prescribing your Eliquis.  In the future your dose may need to be changed if your kidney function or weight changes by a significant amount or as you get older.  What do you do if you miss a dose? If you miss a dose, take it as soon as you remember on the same day and resume taking twice daily.  Do not take more than one dose of ELIQUIS at the same time to make up a missed dose.  Important  Safety Information A possible side effect of Eliquis is bleeding. You should call your healthcare provider right away if you experience any of the following: Bleeding from an injury or your nose that does not stop. Unusual colored urine (red or dark brown) or unusual colored stools (red or black). Unusual bruising for unknown reasons. A serious fall or if you hit your head (even if there is no bleeding).  Some medicines may interact with Eliquis and might increase your risk of bleeding or clotting while on Eliquis. To help avoid this, consult your healthcare provider or pharmacist prior to using any new prescription or non-prescription medications, including herbals, vitamins, non-steroidal anti-inflammatory drugs (NSAIDs) and supplements.  This website has more information on Eliquis (apixaban): http://www.eliquis.com/eliquis/home   Atrial Fibrillation    Atrial fibrillation is a type of heartbeat that is irregular or fast. If you have this condition, your heart beats without any order. This makes it hard for your heart to pump blood in a normal way. Atrial fibrillation may come and go, or it may become a long-lasting problem. If this condition is not treated, it can put you at higher risk for stroke, heart failure, and other heart problems.  What are the causes? This condition may be caused by diseases that damage the heart. They include: High blood pressure. Heart failure. Heart valve disease. Heart surgery. Other causes include: Diabetes. Thyroid disease. Being overweight. Kidney disease. Sometimes the cause is not known.  What increases the risk? You are more likely to develop this condition if: You are older. You smoke. You exercise often and very hard. You have a family history of this condition. You are a man. You use drugs. You drink a lot of alcohol. You have lung conditions, such as emphysema, pneumonia, or COPD. You have sleep apnea.  What are the signs or  symptoms? Common symptoms of this condition include: A feeling that your heart is beating very fast. Chest pain or discomfort. Feeling short of breath. Suddenly feeling light-headed or weak. Getting tired easily during activity. Fainting. Sweating. In some cases, there are no symptoms.  How is this treated? Treatment for this condition depends on underlying conditions and how you feel when you have atrial fibrillation. They include: Medicines to: Prevent blood clots. Treat heart rate or heart rhythm problems. Using devices, such as a pacemaker, to correct heart rhythm problems. Doing surgery to remove the part of the heart that sends bad signals. Closing an area where  clots can form in the heart (left atrial appendage). In some cases, your doctor will treat other underlying conditions.  Follow these instructions at home:  Medicines Take over-the-counter and prescription medicines only as told by your doctor. Do not take any new medicines without first talking to your doctor. If you are taking blood thinners: Talk with your doctor before you take any medicines that have aspirin or NSAIDs, such as ibuprofen, in them. Take your medicine exactly as told by your doctor. Take it at the same time each day. Avoid activities that could hurt or bruise you. Follow instructions about how to prevent falls. Wear a bracelet that says you are taking blood thinners. Or, carry a card that lists what medicines you take. Lifestyle         Do not use any products that have nicotine or tobacco in them. These include cigarettes, e-cigarettes, and chewing tobacco. If you need help quitting, ask your doctor. Eat heart-healthy foods. Talk with your doctor about the right eating plan for you. Exercise regularly as told by your doctor. Do not drink alcohol. Lose weight if you are overweight. Do not use drugs, including cannabis.  General instructions If you have a condition that causes breathing to  stop for a short period of time (apnea), treat it as told by your doctor. Keep a healthy weight. Do not use diet pills unless your doctor says they are safe for you. Diet pills may make heart problems worse. Keep all follow-up visits as told by your doctor. This is important.  Contact a doctor if: You notice a change in the speed, rhythm, or strength of your heartbeat. You are taking a blood-thinning medicine and you get more bruising. You get tired more easily when you move or exercise. You have a sudden change in weight.  Get help right away if:    You have pain in your chest or your belly (abdomen). You have trouble breathing. You have side effects of blood thinners, such as blood in your vomit, poop (stool), or pee (urine), or bleeding that cannot stop. You have any signs of a stroke. "BE FAST" is an easy way to remember the main warning signs: B - Balance. Signs are dizziness, sudden trouble walking, or loss of balance. E - Eyes. Signs are trouble seeing or a change in how you see. F - Face. Signs are sudden weakness or loss of feeling in the face, or the face or eyelid drooping on one side. A - Arms. Signs are weakness or loss of feeling in an arm. This happens suddenly and usually on one side of the body. S - Speech. Signs are sudden trouble speaking, slurred speech, or trouble understanding what people say. T - Time. Time to call emergency services. Write down what time symptoms started. You have other signs of a stroke, such as: A sudden, very bad headache with no known cause. Feeling like you may vomit (nausea). Vomiting. A seizure.  These symptoms may be an emergency. Do not wait to see if the symptoms will go away. Get medical help right away. Call your local emergency services (911 in the [place] Do not drive yourself to the hospital. Summary Atrial fibrillation is a type of heartbeat that is irregular or fast. You are at higher risk of this condition if you smoke, are  older, have diabetes, or are overweight. Follow your doctor's instructions about medicines, diet, exercise, and follow-up visits. Get help right away if you have signs or symptoms of  a stroke. Get help right away if you cannot catch your breath, or you have chest pain or discomfort. This information is not intended to replace advice given to you by your health care provider. Make sure you discuss any questions you have with your health care provider. Document Revised: 10/18/2018 Document Reviewed: 10/18/2018 Elsevier Patient Education  2020 [employer].

## 2020-11-10 NOTE — H&P (Addendum)
Date: 11/10/2020               Patient Name:  Lynn Knox MRN: [id number]  DOB: 22-Jan-1925 Age / Sex: 87 y.o., female   PCP: Medicine, [facility]         Medical Service: Internal Medicine Teaching Service         Attending Physician: Dr. Oswaldo Done, Marquita Palms, *    First Contact: Dr. Ilene Qua Pager: [phone]  Second Contact: Dr. Dolan Amen Pager: [phone]       After Hours (After 5p/  First Contact Pager: [phone]  weekends / holidays): Second Contact Pager: [phone]   Chief Complaint: Left Hip pain  History of Present Illness: Lynn Knox is a 87 y.o. F who complains of left hip pain. She says that she came to the hospital via EMS after she couldn't deal with the pain anymore. She says that she was walking to feed the birds and fell about four days ago on her left side, but did not hit her head. EMS was called at the time of the incident but she felt that she did not need to go to the hospital at that time since she could still walk. However when she woke up today she had a lot more pain. She has only taken tylenol for the pain. She denies any numbness, tingling, or problems moving her legs. Denies any previous falls or fractures besides a remote history of a patellar fracture that occurred ice skating probably decades ago.  She has been in her normal state of health recently.  No changes in her medications.  No recent illnesses.  Denies any chest pain, shortness of breath, dizziness, or palpitations.  She is independent in all activities of daily living.  Meds: Cetirizine 10 mg daily Montelukast 10 mg daily Fluticasone furoate-vilanterol daily Ipratropium-albuterol 19mL nebulizer 4x daily PRN Pravastatin 20 mg daily Lisinopril 20 mg daily Hydrochlorothiazide 25 mg daily Levothyroxine once daily  Allergies: Allergies as of 11/10/2020 - Review Complete 11/10/2020  Allergen Reaction Noted   Doxycycline  12/26/2018   Past Medical History:   Diagnosis Date   Asthma    COPD (chronic obstructive pulmonary disease) (HCC)    Hyperlipemia    Hypertension    Thyroid disease     Family History: Endorses a family history of a Gyn cancer in her mother, otherwise negative  Social History: Retired, 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 Ambulates with a walker  Review of Systems: A complete ROS was negative except as per HPI.   Physical Exam: Blood pressure 112/66, pulse 83, temperature 97.8 F (36.6 C), temperature source Oral, resp. rate 18, SpO2 99 %. Gen: Elderly woman resting comfortably in bed in NAD HEENT: normocephalic atraumatic, sclerae anicteric, conjunctiva non-erythematous CV: irregularly irregular rhythm, no m/r/g Resp: Normal WOB, CTAB GI: nontender nondistnded, bowel sounds present MSK: able to move all four extremities spontaneously Skin: ecchymosis present on the medial and posterior left thigh  Neuro: A&Ox3, No focal deficits  XR Pelvis:  IMPRESSION: 1. Minimally displaced fracture of the greater trochanter of the left proximal femur. 2. No other fractures.  No dislocation. No acute cardiopulmonary disease.  CT Hip Left WO contrast: Mildly comminuted greater trochanteric fracture. Many such fractures actually turn out to have a linear nondisplaced intertrochanteric component, but no such intertrochanteric extension is readily apparent on today's CT. 2. Nonspecific presacral and left pelvic sidewall  edema. 3. Other imaging findings of potential clinical significance: Aortic Atherosclerosis (ICD10-I70.0). Calcified uterine fibroid. Sigmoid colon diverticulosis.  Assessment & Plan by Problem:  Principal Problem:   Displaced fracture of greater trochanter of left femur, initial encounter for closed fracture [facility]) Active Problems:   Atrial fibrillation [facility])   Hypertension  Lynn Knox is a 87 year old F with a PMH of COPD, asthma, HLD,  HTN, and thyroid disease presenting via EMS after a fall and subsequently found to have a left comminuted greater trochanteric fracture.  Comminuted greater trochanteric fracture Ortho service planning for operative management today. Medically stable and seems well optimized for surgery today.   -NPO for surgery -OR posting for INTRAMEDULLARY (IM) NAIL INTERTROCHANTRIC procedure on 7/4 at 1430. -tylenol 650 Q6PRN for pain, will likely need stronger medication following surgery.   Atrial Fibrillation: Recent diagnosis of atrial fibrillation with her primary care physician.  Currently stable, asymptomatic, ventricular rate of around 100 bpm.  Seems stable enough today for surgery, postoperatively when things stabilize we can work on medication management.  Elevated risk for stroke given advanced age and hypertension. -Echocardiogram tomorrow to evaluate for structural heart disease -We will start beta-blocker sometime postoperatively -We will plan on anticoagulation with apixaban about 1 day postoperatively depending on how the surgery goes  COPD/Asthma: Stable respiratory status, not requiring supplemental oxygen -continue home albuterol and  breo ellipta  HTN Cr minorly elevated to 1.07 -holding home lisinopril 25 mg  HLD -continue home pravastatin 20 mg daily  Hypothyroidism TSH WNL -continue home levothyroxine daily   Dispo: Admit patient to Inpatient with expected length of stay greater than 2 midnights.  Signed: Ilene Qua, MD 11/10/2020, 1:21 PM  Pager: [phone] After 5pm on weekdays and 1pm on weekends: On Call pager: [phone]

## 2020-11-10 NOTE — Op Note (Signed)
OPERATIVE REPORT  SURGEON: Samson Frederic, MD   ASSISTANT: Barrie Dunker, PA-C.  PREOPERATIVE DIAGNOSIS: Comminuted Left peritrochanteric femur fracture.   POSTOPERATIVE DIAGNOSIS: Comminuted Left peritrochanteric femur fracture.   PROCEDURE: Intramedullary fixation, Left femur.   IMPLANTS: Biomet Affixus Hip Fracture Nail, 9 by 180 mm, 125 degrees. 10.5 x 180 mm Hip Fracture Nail Lag Screw. 5 x 32 mm distal interlocking screw 1.  ANESTHESIA:  General  ESTIMATED BLOOD LOSS:-50 mL    ANTIBIOTICS: 2 g Ancef.  DRAINS: None.  COMPLICATIONS: None.   CONDITION: PACU - hemodynamically stable.Marland Kitchen   BRIEF CLINICAL NOTE: Lynn Knox is a 87 y.o. female who presented with a comminuted left greater trochanter fracture with extension down the posterior cortex of the proximal femur, which was felt to be high risk for propagation. The patient was admitted to the hospitalist service and underwent perioperative risk stratification and medical optimization. The risks, benefits, and alternatives to the procedure were explained, and the patient elected to proceed.  PROCEDURE IN DETAIL: Surgical site was marked by myself. The patient was taken to the operating room and anesthesia was induced on the bed. The patient was then transferred to the [facility] table and the nonoperative lower extremity was scissored underneath the operative side. The fracture was reduced with traction, internal rotation, and adduction. The hip was prepped and draped in the normal sterile surgical fashion. Timeout was called verifying side and site of surgery. Preop antibiotics were given with 60 minutes of beginning the procedure.  Fluoroscopy was used to define the patient's anatomy. A 4 cm incision was made just proximal to the tip of the greater trochanter. The awl was used to obtain the standard starting point for a trochanteric entry nail under fluoroscopic control. The guidepin was placed. The entry reamer was used to open the  proximal femur.  On the back table, the nail was assembled onto the jig. The nail was placed into the femur without any difficulty. Through a separate stab incision, the cannula was placed down to the bone in preparation for the cephalomedullary device. A guidepin was placed into the femoral head using AP and lateral fluoroscopy views. The pin was measured, and then reaming was performed to the appropriate depth. The lag screw was inserted to the appropriate depth. The fracture was compressed through the jig. The setscrew was tightened and then loosened one quarter turn. A separate stab incision was created, and the distal interlocking screw was placed using standard AO technique. The jig was removed. Final AP and lateral fluoroscopy views were obtained to confirm fracture reduction and hardware placement. Tip apex distance was appropriate. There was no chondral penetration.  The wounds were copiously irrigated with saline. The wound was closed in layers with #1 Vicryl for the fascia, 2-0 Monocryl for the deep dermal layer, and 3-0 Monocryl subcuticular stitch. Glue was applied to the skin. Once the glue was fully hardened, sterile dressing was applied. The patient was then awakened from anesthesia and taken to the PACU in stable condition. Sponge needle and instrument counts were correct at the end of the case 2. There were no known complications.  We will readmit the patient to the hospitalist. Weightbearing status will be weightbearing as tolerated with a walker. We will begin Lovenox for DVT prophylaxis tomorrow morning, and discharge on ASA 81 mg PO BID. The patient will work with physical therapy and undergo disposition planning.  Please note that a surgical assistant was a medical necessity for this procedure to perform it in  a safe and expeditious manner. Assistant was necessary to provide appropriate retraction of vital neurovascular structures, to prevent femoral fracture, and to allow for  anatomic placement of the prosthesis.

## 2020-11-10 NOTE — Transfer of Care (Signed)
Immediate Anesthesia Transfer of Care Note  Patient: Lynn Knox  Procedure(s) Performed: INTRAMEDULLARY (IM) NAIL INTERTROCHANTRIC (Left)  Patient Location: PACU  Anesthesia Type:General  Level of Consciousness: awake and alert   Airway & Oxygen Therapy: Patient Spontanous Breathing and Patient connected to face mask oxygen  Post-op Assessment: Report given to RN and Post -op Vital signs reviewed and stable  Post vital signs: Reviewed and stable  Last Vitals:  Vitals Value Taken Time  BP 157/90 11/10/20 1701  Temp    Pulse 109 11/10/20 1706  Resp 24 11/10/20 1706  SpO2 100 % 11/10/20 1706  Vitals shown include unvalidated device data.  Last Pain:  Vitals:   11/10/20 1008  TempSrc: Oral  PainSc:          Complications: No notable events documented.

## 2020-11-10 NOTE — Anesthesia Procedure Notes (Signed)
Procedure Name: Intubation Date/Time: 11/10/2020 4:09 PM Performed by: Bryson Corona, CRNA Pre-anesthesia Checklist: Patient identified, Emergency Drugs available, Suction available and Patient being monitored Patient Re-evaluated:Patient Re-evaluated prior to induction Oxygen Delivery Method: Circle System Utilized Preoxygenation: Pre-oxygenation with 100% oxygen Induction Type: IV induction Ventilation: Mask ventilation without difficulty Laryngoscope Size: Mac and 3 Grade View: Grade II Tube type: Oral Tube size: 7.0 mm Number of attempts: 1 Airway Equipment and Method: Stylet Placement Confirmation: ETT inserted through vocal cords under direct vision, positive ETCO2 and breath sounds checked- equal and bilateral Secured at: 22 cm Tube secured with: Tape Dental Injury: Teeth and Oropharynx as per pre-operative assessment

## 2020-11-10 NOTE — ED Triage Notes (Signed)
Patient brought in by [facility] c/o left hip pain when ambulating. Patient had a fall Thursday and had a lift assist but refused to be evaluated. Large bruise noted to left inner thigh. Denies any blood thinners. Patient lives at home with son.

## 2020-11-10 NOTE — ED Notes (Signed)
Pt transported to CT ?

## 2020-11-10 NOTE — Consult Note (Addendum)
ORTHOPAEDIC CONSULTATION  REQUESTING PHYSICIAN: Derwood Kaplan, MD  PCP:  Medicine, [facility]  Chief Complaint: Left hip pain  HPI: Lynn Knox is a 87 y.o. female who complains of left hip pain. Patient presents to emergency department via EMS with chief complaint of fall happening x 4 days ago.  States she tripped while walking out the door.  Since then she has been having pain in her left leg and having a hard time getting around.  At baseline she ambulates with a walker. She has been taking Tylenol for pain, none today. She denies any numbness, tingling, swelling.   Additional history obtained by patient's son and grandson over the phone by ED provider.  They state patient was walking out the back door while holding a 10 pound bag of bird seed and tripped landing on her left side.  She did not hit her head and there was no loss of consciousness.  Patient crawled back into the apartment and EMS was called to help her get up.  She denied transport at that time.  Since the fall she has had difficulty ambulating and getting around.  Her son states she has been slow to function and complaining of pain in her left leg.  They decided to call EMS this morning because patient could not get out of bed.  No changes in mental status.  No history of UTI although he would like to have a urine sample checked here.  Patient does have home health that has a nurse come out once a month, was last there x4 days ago.  Has not been seen by orthopedics in the past.  Orthopedics was consulted for management of her left hip injury Past Medical History:  Diagnosis Date   Asthma    COPD (chronic obstructive pulmonary disease) (HCC)    Hyperlipemia    Hypertension    Thyroid disease    Past Surgical History:  Procedure Laterality Date   TONSILLECTOMY     Social History   Socioeconomic History   Marital status: Unknown    Spouse name: Not on file   Number of children: Not on file    Years of education: Not on file   Highest education level: Not on file  Occupational History   Not on file  Tobacco Use   Smoking status: Never   Smokeless tobacco: Never  Vaping Use   Vaping Use: Never used  Substance and Sexual Activity   Alcohol use: Never   Drug use: Never   Sexual activity: Not on file  Other Topics Concern   Not on file  Social History Narrative   Not on file   Social Determinants of Health   Financial Resource Strain: Not on file  Food Insecurity: Not on file  Transportation Needs: Not on file  Physical Activity: Not on file  Stress: Not on file  Social Connections: Not on file   No family history on file. Allergies  Allergen Reactions   Doxycycline     Pt states makes her eye red   Prior to Admission medications   Medication Sig Start Date End Date Taking? Authorizing Provider  albuterol (VENTOLIN HFA) 108 (90 Base) MCG/ACT inhaler Inhale 2 puffs into the lungs every 6 (six) hours as needed. 03/09/19   [provider]  cetirizine (ZYRTEC) 10 MG tablet TAKE 1 TABLET BY MOUTH DAILY 05/05/20   Lorin Glass, MD  fluticasone furoate-vilanterol (BREO ELLIPTA) 100-25 MCG/INH AEPB Inhale 1 puff into  the lungs daily. 05/28/19   Lorin Glass, MD  fluticasone furoate-vilanterol (BREO ELLIPTA) 100-25 MCG/INH AEPB Inhale 1 puff into the lungs daily. 06/26/19   Lorin Glass, MD  hydrochlorothiazide (HYDRODIURIL) 25 MG tablet Take 25 mg by mouth daily. 01/14/19   [provider]  ipratropium (ATROVENT HFA) 17 MCG/ACT inhaler Inhale 2 puffs into the lungs every 4 (four) hours as needed for wheezing. 05/11/19   Alvira Monday, MD  ipratropium-albuterol (DUONEB) 0.5-2.5 (3) MG/3ML SOLN Take 3 mLs by nebulization 4 (four) times daily as needed. 05/28/19   Lorin Glass, MD  levothyroxine (SYNTHROID) 25 MCG tablet Take 25 mcg by mouth daily. 01/14/19   [provider]  lisinopril (ZESTRIL) 20 MG tablet Take 20 mg by mouth daily. 01/14/19    [provider]  montelukast (SINGULAIR) 10 MG tablet TAKE 1 TABLET(10 MG) BY MOUTH DAILY 05/05/20   Lorin Glass, MD  pravastatin (PRAVACHOL) 20 MG tablet Take 20 mg by mouth daily. 01/14/19   [provider]   DG Chest 1 View  Result Date: 11/10/2020 CLINICAL DATA:  Possible left hip fracture.  Fall yesterday. EXAM: CHEST  1 VIEW COMPARISON:  05/11/2019 FINDINGS: Cardiac silhouette is normal in size. No mediastinal or hilar masses. Minor linear opacities at the left lung base consistent with scarring. Lungs otherwise clear. No convincing pleural effusion or pneumothorax. Skeletal structures are demineralized but grossly intact. IMPRESSION: No acute cardiopulmonary disease. Electronically Signed   By: Amie Portland M.D.   On: 11/10/2020 11:15   DG Pelvis 1-2 Views  Result Date: 11/10/2020 CLINICAL DATA:  Fall yesterday.  Left hip pain. EXAM: PELVIS - 1-2 VIEW COMPARISON:  None. FINDINGS: Fracture of the greater trochanter of the left proximal femur, displaced/retracted superiorly by 5-6 mm. No other convincing fracture.  No bone lesions. Hip joints, SI joints and symphysis pubis are normally aligned. Skeletal structures are diffusely demineralized. IMPRESSION: 1. Minimally displaced fracture of the greater trochanter of the left proximal femur. 2. No other fractures.  No dislocation. Electronically Signed   By: Amie Portland M.D.   On: 11/10/2020 11:12   DG Femur Min 2 Views Left  Result Date: 11/10/2020 CLINICAL DATA:  Larey Seat yesterday.  Left hip pain. EXAM: LEFT FEMUR 2 VIEWS COMPARISON:  Current pelvis radiographs. FINDINGS: There is a mildly comminuted fracture of the greater trochanter of the left proximal femur, without significant displacement. Although not visualized, and intertrochanteric fracture component is not excluded. No bone lesions.  Hip joint is normally spaced and aligned. IMPRESSION: 1. Mildly comminuted fracture of the greater trochanter of the left proximal femur.  Cannot exclude an occult underlying intertrochanteric component of this fracture, which would be better assessed with either high-resolution unenhanced CT or MRI. Electronically Signed   By: Amie Portland M.D.   On: 11/10/2020 11:14    Positive ROS: All other systems have been reviewed and were otherwise negative with the exception of those mentioned in the HPI and as above.  Physical Exam: General: Alert, no acute distress Cardiovascular: No pedal edema Respiratory: No cyanosis, no use of accessory musculature GI: No organomegaly, abdomen is soft and non-tender Skin: No lesions in the area of chief complaint Neurologic: Sensation intact distally Psychiatric: Patient is competent for consent with normal mood and affect Lymphatic: No axillary or cervical lymphadenopathy  MUSCULOSKELETAL: Pelvis is stable. Tender to palpation of left hip. Full ROM of left hip, knee, and ankle. Large area of ecchymosis on medial aspect of left thigh.  No open wounds. Compartments soft in left lower extremity. Able to wiggle toes. DP pulses 2+ bilaterally.   Assessment: Mildly comminuted fracture of the greater trochanter of the left proximal femur  Plan: Left hip fracture: CT hip (-) for IT extension, however fx line proceeds distal to the level of LT on posterior cortex. This is high risk for propagation.  I discussed the risks and benefits of intramedullary nailing to the patient. Risks include infection and blood clots. Alternatives are not having surgery, which would keep the patient from being able to ambulate without severe pain. The patient consents to surgery.  Keep NPO. Hold chemical DVT prophylaxis. Admit to hospitalist service for medical management    Darrick Grinder, [place] [phone]    11/10/2020 12:51 PM

## 2020-11-10 NOTE — ED Provider Notes (Signed)
[facility] EMERGENCY DEPARTMENT Provider Note   CSN: [id number] Arrival date & time: 11/10/20  3419     History Chief Complaint  Patient presents with   Marletta Lor    Lynn Knox is a 87 y.o. female with past medical history significant for asthma, COPD, hypertension, hyperlipidemia, thyroid disease. Takes daily aspirin.  HPI Patient presents to emergency department via EMS with chief complaint of fall happening x 4 days ago.  States she tripped while walking out the door.  Since then she has been having pain in her left leg and having a hard time getting around.  At baseline she ambulates with a walker.  Patient states she did not have any prodrome of dizziness, chest pain, shortness of breath or weakness prior to the fall.  Has been taking Tylenol for pain, none today. She denies any numbness, tingling, swelling.  Additional history obtained by patient's son and grandson over the phone.  They state patient was walking out the back door while holding a 10 pound bag of bird seed and tripped landing on her left side.  She did not hit her head and there was no loss of consciousness.  Patient crawled back into the apartment and EMS was called to help her get up.  She denied transport at that time.  Since the fall she has had difficulty ambulating and getting around.  Her son states she has been slow to function and complaining of pain in her left leg.  They decided to call EMS this morning because patient could not get out of bed.  No changes in mental status.  No history of UTI although he would like to have a urine sample checked here.  Patient does have home health that has a nurse come out once a month, was last there x4 days ago.  Has not been seen by orthopedics in the past.     Past Medical History:  Diagnosis Date   Asthma    COPD (chronic obstructive pulmonary disease) (HCC)    Hyperlipemia    Hypertension    Thyroid disease     Patient Active Problem List    Diagnosis Date Noted   Asthma 05/28/2019    Past Surgical History:  Procedure Laterality Date   TONSILLECTOMY       OB History   No obstetric history on file.     No family history on file.  Social History   Tobacco Use   Smoking status: Never   Smokeless tobacco: Never  Vaping Use   Vaping Use: Never used  Substance Use Topics   Alcohol use: Never   Drug use: Never    Home Medications Prior to Admission medications   Medication Sig Start Date End Date Taking? Authorizing Provider  albuterol (VENTOLIN HFA) 108 (90 Base) MCG/ACT inhaler Inhale 2 puffs into the lungs every 6 (six) hours as needed. 03/09/19   [provider]  cetirizine (ZYRTEC) 10 MG tablet TAKE 1 TABLET BY MOUTH DAILY 05/05/20   Lorin Glass, MD  fluticasone furoate-vilanterol (BREO ELLIPTA) 100-25 MCG/INH AEPB Inhale 1 puff into the lungs daily. 05/28/19   Lorin Glass, MD  fluticasone furoate-vilanterol (BREO ELLIPTA) 100-25 MCG/INH AEPB Inhale 1 puff into the lungs daily. 06/26/19   Lorin Glass, MD  hydrochlorothiazide (HYDRODIURIL) 25 MG tablet Take 25 mg by mouth daily. 01/14/19   [provider]  ipratropium (ATROVENT HFA) 17 MCG/ACT inhaler Inhale 2 puffs into the lungs every 4 (four) hours as  needed for wheezing. 05/11/19   Alvira Monday, MD  ipratropium-albuterol (DUONEB) 0.5-2.5 (3) MG/3ML SOLN Take 3 mLs by nebulization 4 (four) times daily as needed. 05/28/19   Lorin Glass, MD  levothyroxine (SYNTHROID) 25 MCG tablet Take 25 mcg by mouth daily. 01/14/19   [provider]  lisinopril (ZESTRIL) 20 MG tablet Take 20 mg by mouth daily. 01/14/19   [provider]  montelukast (SINGULAIR) 10 MG tablet TAKE 1 TABLET(10 MG) BY MOUTH DAILY 05/05/20   Lorin Glass, MD  pravastatin (PRAVACHOL) 20 MG tablet Take 20 mg by mouth daily. 01/14/19   [provider]    Allergies    Doxycycline  Review of Systems   Review of Systems All other systems are  reviewed and are negative for acute change except as noted in the HPI.  Physical Exam Updated Vital Signs BP 121/75   Pulse 93   Temp 97.8 F (36.6 C) (Oral)   Resp 18   SpO2 99%   Physical Exam Vitals and nursing note reviewed.  Constitutional:      General: She is not in acute distress.    Appearance: She is not ill-appearing.  HENT:     Head: Normocephalic and atraumatic.     Right Ear: Tympanic membrane and external ear normal.     Left Ear: Tympanic membrane and external ear normal.     Nose: Nose normal.     Mouth/Throat:     Mouth: Mucous membranes are moist.     Pharynx: Oropharynx is clear.  Eyes:     General: No scleral icterus.       Right eye: No discharge.        Left eye: No discharge.     Extraocular Movements: Extraocular movements intact.     Conjunctiva/sclera: Conjunctivae normal.     Pupils: Pupils are equal, round, and reactive to light.  Neck:     Vascular: No JVD.  Cardiovascular:     Rate and Rhythm: Normal rate and regular rhythm.     Pulses: Normal pulses.          Radial pulses are 2+ on the right side and 2+ on the left side.     Heart sounds: Normal heart sounds.  Pulmonary:     Comments: Lungs clear to auscultation in all fields. Symmetric chest rise. No wheezing, rales, or rhonchi. Abdominal:     Comments: Abdomen is soft, non-distended, and non-tender in all quadrants. No rigidity, no guarding. No peritoneal signs.  Musculoskeletal:        General: Normal range of motion.     Cervical back: Normal range of motion.     Comments: Pelvis is stable. Tender to palpation of left hip. Full ROM of left hip, knee, and ankle. Large area of ecchymosis on medial aspect of left thigh. No open wounds. Compartments soft in left lower extremity. Able to wiggle toes. DP pulses 2+ bilaterally.  Skin:    General: Skin is warm and dry.     Capillary Refill: Capillary refill takes less than 2 seconds.  Neurological:     Mental Status: She is oriented to  person, place, and time.     GCS: GCS eye subscore is 4. GCS verbal subscore is 5. GCS motor subscore is 6.     Comments: Fluent speech, no facial droop.  Psychiatric:        Behavior: Behavior normal.    ED Results / Procedures / Treatments   Labs (all  labs ordered are listed, but only abnormal results are displayed) Labs Reviewed  URINALYSIS, ROUTINE W REFLEX MICROSCOPIC - Abnormal; Notable for the following components:      Result Value   Hgb urine dipstick SMALL (*)    Leukocytes,Ua SMALL (*)    Bacteria, UA RARE (*)    All other components within normal limits  CBC WITH DIFFERENTIAL/PLATELET - Abnormal; Notable for the following components:   RBC 3.71 (*)    Hemoglobin 11.9 (*)    Monocytes Absolute 1.2 (*)    All other components within normal limits  COMPREHENSIVE METABOLIC PANEL - Abnormal; Notable for the following components:   Glucose, Bld 110 (*)    Creatinine, Ser 1.07 (*)    Albumin 3.2 (*)    AST 49 (*)    Total Bilirubin 1.8 (*)    GFR, Estimated 48 (*)    All other components within normal limits  RESP PANEL BY RT-PCR (FLU A&B, COVID) [id number]  URINE CULTURE    EKG None  Radiology DG Chest 1 View  Result Date: 11/10/2020 CLINICAL DATA:  Possible left hip fracture.  Fall yesterday. EXAM: CHEST  1 VIEW COMPARISON:  05/11/2019 FINDINGS: Cardiac silhouette is normal in size. No mediastinal or hilar masses. Minor linear opacities at the left lung base consistent with scarring. Lungs otherwise clear. No convincing pleural effusion or pneumothorax. Skeletal structures are demineralized but grossly intact. IMPRESSION: No acute cardiopulmonary disease. Electronically Signed   By: Amie Portland M.D.   On: 11/10/2020 11:15   DG Pelvis 1-2 Views  Result Date: 11/10/2020 CLINICAL DATA:  Fall yesterday.  Left hip pain. EXAM: PELVIS - 1-2 VIEW COMPARISON:  None. FINDINGS: Fracture of the greater trochanter of the left proximal femur, displaced/retracted superiorly by 5-6 mm.  No other convincing fracture.  No bone lesions. Hip joints, SI joints and symphysis pubis are normally aligned. Skeletal structures are diffusely demineralized. IMPRESSION: 1. Minimally displaced fracture of the greater trochanter of the left proximal femur. 2. No other fractures.  No dislocation. Electronically Signed   By: Amie Portland M.D.   On: 11/10/2020 11:12   DG Femur Min 2 Views Left  Result Date: 11/10/2020 CLINICAL DATA:  Larey Seat yesterday.  Left hip pain. EXAM: LEFT FEMUR 2 VIEWS COMPARISON:  Current pelvis radiographs. FINDINGS: There is a mildly comminuted fracture of the greater trochanter of the left proximal femur, without significant displacement. Although not visualized, and intertrochanteric fracture component is not excluded. No bone lesions.  Hip joint is normally spaced and aligned. IMPRESSION: 1. Mildly comminuted fracture of the greater trochanter of the left proximal femur. Cannot exclude an occult underlying intertrochanteric component of this fracture, which would be better assessed with either high-resolution unenhanced CT or MRI. Electronically Signed   By: Amie Portland M.D.   On: 11/10/2020 11:14    Procedures Procedures   Medications Ordered in ED Medications  acetaminophen (TYLENOL) tablet 650 mg (650 mg Oral Given 11/10/20 1101)    ED Course  I have reviewed the triage vital signs and the nursing notes.  Pertinent labs & imaging results that were available during my care of the patient were reviewed by me and considered in my medical decision making (see chart for details).    MDM Rules/Calculators/A&P                          History provided by patient with additional history obtained from her son and grandson as well  as chart review.     Presenting after mechanical fall with left hip and thigh pain. No prodrome of concerning symptoms per family. No head injury, not anticoagulated. On exam patient is afebrile, HDS. She has large area of ecchymosis to left  thigh. NVI distally, soft compartments. Tylenol given for pain. Family asked for UA to be checked. UA shows small hemoglobinuria, small leukocytes, 6-10 WBC and rare bacteria.  Patient does not have urinary symptoms at this time so we will hold off on any antibiotic treatment and send for urine culture.  Xrays obtained of pelvis and left femur. I viewed images.  X-ray of pelvis shows minimally displaced fracture of the greater trochanter of the left proximal femur.  No dislocation. Xray of left femur shows mildly comminuted fracture of greater trochanter of the left proximal femur.  Radiologist comments that they cannot exclude occult underlying intertrochanteric component of this fracture, MRI or an unenhanced CT recommended. Agree with radiologist impression.   Consulted on call orthopedist Dr. Linna CapriceSwinteck who recommends CT of hip and medical admission. She will possibly need surgery. CT has been ordered. CBC without leukocytosis, hemoglobin 11.9, prior to compare this to a year ago when it was 12.7.  CMP with kidney function at baseline, does have slightly elevated total bili of 1.8.  She has no abdominal tenderness.  No indications for doing of this at this time.  COVID test in process.  Updated patient's grandson with plan of care.  He is agreeable with this and can be reached anytime by phone.  Unassigned admission. Spoke with IM service who agrees to assume care of patient and bring into the hospital for further evaluation and management.  CT hip pending at time of admission.   Portions of this note were generated with Scientist, clinical (histocompatibility and immunogenetics)Dragon dictation software. Dictation errors may occur despite best attempts at proofreading.   Final Clinical Impression(s) / ED Diagnoses Final diagnoses:  Closed fracture of trochanter of left femur, initial encounter [facility](HCC)    Rx / DC Orders ED Discharge Orders     None        Kandice HamsWalisiewicz, Lizzy Hamre E, PA-C 11/10/20 1217    Derwood KaplanNanavati, Ankit, MD 11/11/20 [phone]

## 2020-11-10 NOTE — Anesthesia Postprocedure Evaluation (Signed)
Anesthesia Post Note  Patient: Lynn Knox  Procedure(s) Performed: INTRAMEDULLARY (IM) NAIL INTERTROCHANTRIC (Left)     Patient location during evaluation: PACU Anesthesia Type: General Level of consciousness: awake Pain management: pain level controlled Vital Signs Assessment: post-procedure vital signs reviewed and stable Respiratory status: spontaneous breathing Cardiovascular status: stable Postop Assessment: no apparent nausea or vomiting Anesthetic complications: no   No notable events documented.  Last Vitals:  Vitals:   11/10/20 1445 11/10/20 1700  BP: 129/66 (!) 157/90  Pulse: (!) 51 65  Resp: 17 17  Temp:    SpO2: 98% (!) 75%    Last Pain:  Vitals:   11/10/20 1008  TempSrc: Oral  PainSc:                  Aldrick Derrig

## 2020-11-10 NOTE — ED Notes (Signed)
Patient still in imaging. Delay in lab draw, meds and covid swab.

## 2020-11-11 ENCOUNTER — Inpatient Hospital Stay (HOSPITAL_COMMUNITY): Payer: Medicare Other

## 2020-11-11 ENCOUNTER — Other Ambulatory Visit (HOSPITAL_COMMUNITY): Payer: Self-pay

## 2020-11-11 DIAGNOSIS — I4891 Unspecified atrial fibrillation: Secondary | ICD-10-CM

## 2020-11-11 LAB — URINE CULTURE: Culture: NO GROWTH

## 2020-11-11 LAB — BASIC METABOLIC PANEL
Anion gap: 6 (ref 5–15)
BUN: 23 mg/dL (ref 8–23)
CO2: 28 mmol/L (ref 22–32)
Calcium: 8.4 mg/dL — ABNORMAL LOW (ref 8.9–10.3)
Chloride: 103 mmol/L (ref 98–111)
Creatinine, Ser: 0.96 mg/dL (ref 0.44–1.00)
GFR, Estimated: 55 mL/min — ABNORMAL LOW (ref >60)
Glucose, Bld: 130 mg/dL — ABNORMAL HIGH (ref 70–99)
Potassium: 3.7 mmol/L (ref 3.5–5.1)
Sodium: 137 mmol/L (ref 135–145)

## 2020-11-11 LAB — CBC
HCT: 26.3 % — ABNORMAL LOW (ref 36.0–46.0)
Hemoglobin: 8.7 g/dL — ABNORMAL LOW (ref 12.0–15.0)
MCH: 32.3 pg (ref 26.0–34.0)
MCHC: 33.1 g/dL (ref 30.0–36.0)
MCV: 97.8 fL (ref 80.0–100.0)
Platelets: 217 10*3/uL (ref 150–400)
RBC: 2.69 MIL/uL — ABNORMAL LOW (ref 3.87–5.11)
RDW: 14.6 % (ref 11.5–15.5)
WBC: 10.4 10*3/uL (ref 4.0–10.5)
nRBC: 0 % (ref 0.0–0.2)

## 2020-11-11 LAB — ECHOCARDIOGRAM COMPLETE
AR max vel: 1.59 cm2
AV Area VTI: 1.58 cm2
AV Area mean vel: 1.46 cm2
AV Mean grad: 4 mmHg
AV Peak grad: 7.2 mmHg
Ao pk vel: 1.34 m/s
Height: 61 in
P 1/2 time: 321 msec
S' Lateral: 2.1 cm
Weight: 2624 oz

## 2020-11-11 LAB — VITAMIN D 25 HYDROXY (VIT D DEFICIENCY, FRACTURES): Vit D, 25-Hydroxy: 33.98 ng/mL (ref 30–100)

## 2020-11-11 MED ORDER — HYDROCODONE-ACETAMINOPHEN 7.5-325 MG PO TABS: 1.0000 | ORAL_TABLET | ORAL | 0 refills | Status: DC | PRN

## 2020-11-11 MED ORDER — MUPIROCIN 2 % EX OINT
1.0000 "application " | TOPICAL_OINTMENT | Freq: Two times a day (BID) | CUTANEOUS | Status: DC
  Administered 2020-11-11 – 2020-11-13 (×5): 1 via NASAL
  Filled 2020-11-11: qty 22

## 2020-11-11 MED ORDER — MUPIROCIN 2 % EX OINT: 1.0000 "application " | TOPICAL_OINTMENT | Freq: Two times a day (BID) | CUTANEOUS | Status: DC

## 2020-11-11 MED ORDER — RIVAROXABAN 20 MG PO TABS: 20.0000 mg | ORAL_TABLET | Freq: Every day | ORAL | Status: DC

## 2020-11-11 MED ORDER — APIXABAN 2.5 MG PO TABS
2.5000 mg | ORAL_TABLET | Freq: Two times a day (BID) | ORAL | Status: DC
  Administered 2020-11-11 – 2020-11-13 (×5): 2.5 mg via ORAL
  Filled 2020-11-11 (×6): qty 1

## 2020-11-11 MED ORDER — RIVAROXABAN 10 MG PO TABS: 10.0000 mg | ORAL_TABLET | Freq: Every day | ORAL | Status: DC

## 2020-11-11 MED ORDER — CHLORHEXIDINE GLUCONATE CLOTH 2 % EX PADS: 6.0000 | MEDICATED_PAD | Freq: Every day | CUTANEOUS | Status: DC

## 2020-11-11 MED ORDER — CHLORHEXIDINE GLUCONATE CLOTH 2 % EX PADS
6.0000 | MEDICATED_PAD | Freq: Every day | CUTANEOUS | Status: DC
  Administered 2020-11-11 – 2020-11-13 (×3): 6 via TOPICAL

## 2020-11-11 NOTE — Progress Notes (Signed)
  Echocardiogram attempted. Patient in chair and eating at time of attempt. Will attempt again as schedule permits.  [name] 11/11/2020, 12:17 PM

## 2020-11-11 NOTE — Evaluation (Signed)
Physical Therapy Evaluation Patient Details Name: Lynn Knox MRN: [id number] DOB: 08/12/1924 Today's Date: 11/11/2020   History of Present Illness  87 year old  presented to the emergency department with increasing pain of her left hip after a fall several days ago and found to have a minimally displaced fracture of the left greater trochanter. s/p IM nail 11/10/20. Pt with PMH of atrial fibrillation, COPD, hypertension.  Clinical Impression  Pt admitted with above diagnosis. Mod assist for bed mobility. Pt ambulated 4' with RW, distance limited by pain. ST-SNF recommended as pt lives with disabled son.  Pt currently with functional limitations due to the deficits listed below (see PT Problem List). Pt will benefit from skilled PT to increase their independence and safety with mobility to allow discharge to the venue listed below.       Follow Up Recommendations SNF;Supervision/Assistance - 24 hour;Supervision for mobility/OOB    Equipment Recommendations  None recommended by PT (TBD at next venue)    Recommendations for Other Services       Precautions / Restrictions Precautions Precautions: Fall Restrictions Weight Bearing Restrictions: No Other Position/Activity Restrictions: WBAT      Mobility  Bed Mobility Overal bed mobility: Needs Assistance Bed Mobility: Supine to Sit     Supine to sit: Mod assist     General bed mobility comments: assist to advance and support LLE and to raise trunk, HOB up, used rail    Transfers Overall transfer level: Needs assistance Equipment used: Rolling walker (2 wheeled) Transfers: Sit to/from Stand Sit to Stand: Min assist;From elevated surface         General transfer comment: VCs hand placement, min A to power up  Ambulation/Gait Ambulation/Gait assistance: Min Chemical engineer (Feet): 4 Feet Assistive device: Rolling walker (2 wheeled) Gait Pattern/deviations: Step-to pattern;Decreased step length - right;Decreased step  length - left;Decreased weight shift to left Gait velocity: decr   General Gait Details: VCs sequencing, distance limited by pain  Stairs            Wheelchair Mobility    Modified Rankin (Stroke Patients Only)       Balance Overall balance assessment: Needs assistance Sitting-balance support: Feet supported;Single extremity supported Sitting balance-Leahy Scale: Good     Standing balance support: Bilateral upper extremity supported Standing balance-Leahy Scale: Poor Standing balance comment: relies on BUE support                             Pertinent Vitals/Pain Pain Assessment: Faces Faces Pain Scale: Hurts even more Pain Location: L hip with movement Pain Descriptors / Indicators: Sore Pain Intervention(s): Limited activity within patient's tolerance;Monitored during session;Patient requesting pain meds-RN notified;Ice applied;Repositioned    Home Living Family/patient expects to be discharged to:: Private residence Living Arrangements: Children Available Help at Discharge: Family   Home Access: Level entry     Home Layout: One level Home Equipment: Environmental consultant - 2 wheels      Prior Function Level of Independence: Independent with assistive device(s)         Comments: walks with RW, denies prior falls in past 1 year, lives with son who is "has a bad knee and is disabled"     Higher education careers adviser        Extremity/Trunk Assessment   Upper Extremity Assessment Upper Extremity Assessment: Overall WFL for tasks assessed    Lower Extremity Assessment Lower Extremity Assessment: LLE deficits/detail LLE Deficits / Details: knee ext at least  3/5, hip ~2/5 limited by pain LLE Sensation: WNL LLE Coordination: WNL    Cervical / Trunk Assessment Cervical / Trunk Assessment: Normal  Communication   Communication: No difficulties  Cognition Arousal/Alertness: Awake/alert Behavior During Therapy: WFL for tasks assessed/performed Overall Cognitive  Status: Within Functional Limits for tasks assessed                                        General Comments      Exercises General Exercises - Lower Extremity Long Arc Quad: AROM;Left;5 reps;Seated Heel Slides: AAROM;Left;10 reps;Supine   Assessment/Plan    PT Assessment Patient needs continued PT services  PT Problem List Decreased strength;Decreased activity tolerance;Decreased balance;Decreased mobility;Pain       PT Treatment Interventions Therapeutic activities;Therapeutic exercise;Functional mobility training;Patient/family education    PT Goals (Current goals can be found in the Care Plan section)  Acute Rehab PT Goals Patient Stated Goal: go for walks PT Goal Formulation: With patient Time For Goal Achievement: 11/25/20 Potential to Achieve Goals: Good    Frequency Min 3X/week   Barriers to discharge        Co-evaluation               AM-PAC PT "6 Clicks" Mobility  Outcome Measure Help needed turning from your back to your side while in a flat bed without using bedrails?: A Lot Help needed moving from lying on your back to sitting on the side of a flat bed without using bedrails?: A Lot Help needed moving to and from a bed to a chair (including a wheelchair)?: A Little Help needed standing up from a chair using your arms (e.g., wheelchair or bedside chair)?: A Lot Help needed to walk in hospital room?: A Lot Help needed climbing 3-5 steps with a railing? : Total 6 Click Score: 12    End of Session Equipment Utilized During Treatment: Gait belt Activity Tolerance: Patient tolerated treatment well;Patient limited by pain Patient left: in chair;with call bell/phone within reach;with chair alarm set;with nursing/sitter in room Nurse Communication: Mobility status PT Visit Diagnosis: Difficulty in walking, not elsewhere classified (R26.2);History of falling (Z91.81);Pain Pain - Right/Left: Left Pain - part of body: Hip    Time:  0762-2633 PT Time Calculation (min) (ACUTE ONLY): 21 min   Charges:   PT Evaluation $PT Eval Moderate Complexity: 1 Mod     Tamala Ser PT 11/11/2020  Acute Rehabilitation Services Pager [phone] Office [phone]

## 2020-11-11 NOTE — TOC Initial Note (Addendum)
Transition of Care [facility]) - Initial/Assessment Note    Patient Details  Name: Lynn Knox MRN: [id number] Date of Birth: [birth year]  [facility] CM/SW Contact:    Jimmy Picket, LCSWA Phone Number: 11/11/2020, 3:23 PM  Clinical Narrative:                  CSW spoke to pts grandson Jeannett Senior by phone. CSW introduced self and explained role. Viviann Spare reports PTA pt lived at home with her son. Pts son has physical limitations. Pt was independent but did use a walker most of the time. Pt was able to cook, clean and compete all ADL's.   CSW reviewed PT/OT reccs for SNF. Jeannett Senior stated he believes that is that is best since his father cannot care for her due to his on limitations. Jeannett Senior reports pt has not been to a SNF and they are open to facilities in area. There is no preference. Jeannett Senior reports pt has had the first 2 vaccines. Family would be open to pt getting booster before going to SNF if needed.   TOC will follow.  Expected Discharge Plan: Skilled Nursing Facility Barriers to Discharge: Continued Medical Work up, English as a second language teacher   Patient Goals and CMS Choice Patient states their goals for this hospitalization and ongoing recovery are:: to get stronger CMS Medicare.gov Compare Post Acute Care list provided to:: Patient Choice offered to / list presented to : Patient  Expected Discharge Plan and Services Expected Discharge Plan: Skilled Nursing Facility       Living arrangements for the past 2 months: Single Family Home                                      Prior Living Arrangements/Services Living arrangements for the past 2 months: Single Family Home Lives with:: Adult Children Patient language and need for interpreter reviewed:: Yes Do you feel safe going back to the place where you live?: Yes      Need for Family Participation in Patient Care: Yes (Comment) Care giver support system in place?: Yes (comment) Current home services: DME Criminal  Activity/Legal Involvement Pertinent to Current Situation/Hospitalization: No - Comment as needed  Activities of Daily Living      Permission Sought/Granted Permission sought to share information with : Family Supports, Oceanographer granted to share information with : Yes, Verbal Permission Granted     Permission granted to share info w AGENCY: SNF        Emotional Assessment Appearance:: Appears stated age Attitude/Demeanor/Rapport: Unable to Assess Affect (typically observed): Unable to Assess Orientation: : Oriented to Self Alcohol / Substance Use: Not Applicable Psych Involvement: No (comment)  Admission diagnosis:  Hip fracture (HCC) [S72.009A] Fall [W19.XXXA] Left hip pain [M25.552] Closed fracture of trochanter of left femur, initial encounter (HCC) [S72.102A] Fall, initial encounter [W19.XXXA] Patient Active Problem List   Diagnosis Date Noted   Displaced fracture of greater trochanter of left femur, initial encounter for closed fracture (HCC) 11/10/2020   Hypertension 11/10/2020   Atrial fibrillation (HCC) 06/23/2020   Noise-induced hearing loss of both ears 06/23/2020   Prediabetes 06/23/2020   Urinary incontinence 06/23/2020   Asthma 05/28/2019   PCP:  Medicine, [facility]:   [id number] - [place], [place] - [address] [place]  [address]  Phone: [phone] Fax: [phone]  [facility] - [place], [place] - [address] [place] [address] Phone: [phone] Fax: [phone]     Social Determinants of Health (SDOH) Interventions    Readmission Risk Interventions No flowsheet data found.  Jimmy Picket, Theresia Majors, [place] Clinical Social Worker [phone]

## 2020-11-11 NOTE — Progress Notes (Addendum)
    Subjective:  Patient reports pain as mild to moderate.  Denies N/V/CP/SOB.   Objective:   VITALS:   Vitals:   11/10/20 2047 11/11/20 0834 11/11/20 1300 11/11/20 1304  BP: (!) 93/48   138/60  Pulse: 93   91  Resp: 18 18  19   Temp:    97.8 F (36.6 C)  TempSrc:    Axillary  SpO2: 98%   97%  Weight:      Height:   5\' 1"  (1.549 m)     NAD ABD soft Neurovascular intact Sensation intact distally Intact pulses distally Dorsiflexion/Plantar flexion intact Incision: dressing C/D/I   Lab Results  Component Value Date   WBC 10.4 11/11/2020   HGB 8.7 (L) 11/11/2020   HCT 26.3 (L) 11/11/2020   MCV 97.8 11/11/2020   PLT 217 11/11/2020   BMET    Component Value Date/Time   NA 137 11/11/2020 0151   K 3.7 11/11/2020 0151   CL 103 11/11/2020 0151   CO2 28 11/11/2020 0151   GLUCOSE 130 (H) 11/11/2020 0151   BUN 23 11/11/2020 0151   CREATININE 0.96 11/11/2020 0151   CALCIUM 8.4 (L) 11/11/2020 0151   GFRNONAA 55 (L) 11/11/2020 0151   GFRAA 53 (L) 05/11/2019 2129     Assessment/Plan: 1 Day Post-Op   Principal Problem:   Displaced fracture of greater trochanter of left femur, initial encounter for closed fracture [facility]) Active Problems:   Atrial fibrillation (HCC)   Hypertension   WBAT with walker DVT ppx: Lovenox, SCDs, TEDS    Apixaban at D/C due to new A-fib PO pain control PT/OT ABLA: HgB 8.7 this AM, treat per hospitalist recommendations Dispo: Pending.  F/U with Dr.Swinteck in 2 weeks for staple removal and radiographs     2130 11/11/2020, 1:25 PM [facility] is now 01/12/2021 [address], [place], [employer] [place] Phone: [phone] [email or web address] Facebook  [phone]

## 2020-11-11 NOTE — Progress Notes (Signed)
  Echocardiogram 2D Echocardiogram has been performed.  [name] 11/11/2020, 4:40 PM

## 2020-11-11 NOTE — TOC CAGE-AID Note (Signed)
Transition of Care [facility]) - CAGE-AID Screening   Patient Details  Name: Lynn Knox MRN: [id number] Date of Birth: 1925/02/22  Clinical Narrative:  Patient oriented to person, follows commands. Patient denies alcohol and drug use, no history of substance abuse noted. No need for any resources at this time.  CAGE-AID Screening:    Have You Ever Felt You Ought to Cut Down on Your Drinking or Drug Use?: No Have People Annoyed You By Critizing Your Drinking Or Drug Use?: No Have You Felt Bad Or Guilty About Your Drinking Or Drug Use?: No Have You Ever Had a Drink or Used Drugs First Thing In The Morning to Steady Your Nerves or to Get Rid of a Hangover?: No CAGE-AID Score: 0  Substance Abuse Education Offered: No

## 2020-11-11 NOTE — Progress Notes (Signed)
ANTICOAGULATION CONSULT NOTE - Initial Consult  Pharmacy Consult for apixaban Indication: atrial fibrillation  Allergies  Allergen Reactions   Doxycycline     Pt states makes her eye red    Patient Measurements: Height: 5\' 1"  (154.9 cm) Weight: 74.4 kg (164 lb) IBW/kg (Calculated) : 47.8    Vital Signs: Temp: 97.8 F (36.6 C) (07/05 1304) Temp Source: Axillary (07/05 1304) BP: 138/60 (07/05 1304) Pulse Rate: 91 (07/05 1304)  Labs: Recent Labs    11/10/20 1100 11/10/20 1933 11/11/20 0151 11/11/20 0436  HGB 11.9* 10.3*  --  8.7*  HCT 36.4 31.1*  --  26.3*  PLT 249 241  --  217  CREATININE 1.07* 0.95 0.96  --     Estimated Creatinine Clearance: 33 mL/min (by C-G formula based on SCr of 0.96 mg/dL).   Medical History: Past Medical History:  Diagnosis Date   Asthma    COPD (chronic obstructive pulmonary disease) (HCC)    Hyperlipemia    Hypertension    Thyroid disease       Assessment: 87 yo W with recently diagnosed afib  (CHADS2VASc = 4, echo pending). Pharmacy consulted to start apixaban. Of note, patient admitted with fall and trochanter fracture.   Rivaroxaban was ordered but suggested apixaban given borderline renal function, age and lower overall bleed risk. This patient technically does not meet dose reduction criteria (2 of age >80 , wt < 60 kg, Scr > 1.5) but ClCr is ~30 ml/min and is high fall risk so will start 2.5mg  BID instead of 5mg .    Goal of Therapy:    Monitor platelets by anticoagulation protocol: Yes   Plan:  Start apixaban 2.5mg  BID  Monitor for signs/symptoms of bleeding  Consider increasing to 5mg  BID in the future if stable on reduced dose  97, PharmD, BCPS, [facility] Clinical Pharmacist  Please check AMION for all [facility] Pharmacy phone numbers After 10:00 PM, call Main Pharmacy [phone]

## 2020-11-11 NOTE — Progress Notes (Addendum)
HD#1 Subjective:   Patient feeling a little tired this morning but doing well after surgery. Says that she is in a little bit of pain when she moves in bed but that it isn't too bad. Is looking forward to getting home and wants to work with PT today.  Objective:  Vital signs in last 24 hours: Vitals:   11/10/20 2047 11/11/20 0834 11/11/20 1300 11/11/20 1304  BP: (!) 93/48   138/60  Pulse: 93   91  Resp: 18 18  19   Temp:    97.8 F (36.6 C)  TempSrc:    Axillary  SpO2: 98%   97%  Weight:      Height:   5\' 1"  (1.549 m)    Supplemental O2: Room Air SpO2: 97 % O2 Flow Rate (L/min): 6 L/min   Physical Exam:  Constitutional: well-appearing elderly woman laying in bed, in no acute distress HENT: sclerae anicteric Eyes: conjunctiva non-erythematous Neck: supple Cardiovascular: regular rate and rhythm Pulmonary/Chest: normal work of breathing on room air Abdominal: soft, non-tender, non-distended MSK: normal bulk and tone Neurological: alert & oriented x 3 Skin: incision on left external thigh c/d/i ecchymosis present on the left medial thigh Psych: normal mood and affect  Filed Weights   11/10/20 1515  Weight: 74.4 kg     Intake/Output Summary (Last 24 hours) at 11/11/2020 1423 Last data filed at 11/11/2020 0900 Gross per 24 hour  Intake 1050 ml  Output 350 ml  Net 700 ml   Net IO Since Admission: 700 mL [11/11/20 1423]  Pertinent Labs: CBC Latest Ref Rng & Units 11/11/2020 11/10/2020 11/10/2020  WBC 4.0 - 10.5 K/uL 10.4 12.6(H) 8.5  Hemoglobin 12.0 - 15.0 g/dL 01/11/2021) 10.3(L) 11.9(L)  Hematocrit 36.0 - 46.0 % 26.3(L) 31.1(L) 36.4  Platelets 150 - 400 K/uL 217 241 249    CMP Latest Ref Rng & Units 11/11/2020 11/10/2020 11/10/2020  Glucose 70 - 99 mg/dL 01/11/2021) - 01/11/2021)  BUN 8 - 23 mg/dL 23 - 23  Creatinine 893(Y - 1.00 mg/dL 101(B 5.10 2.58)  Sodium 135 - 145 mmol/L 137 - 140  Potassium 3.5 - 5.1 mmol/L 3.7 - 3.5  Chloride 98 - 111 mmol/L 103 - 105  CO2 22 - 32 mmol/L  28 - 26  Calcium 8.9 - 10.3 mg/dL 5.27) - 9.0  Total Protein 6.5 - 8.1 g/dL - - 6.6  Total Bilirubin 0.3 - 1.2 mg/dL - - 1.8(H)  Alkaline Phos 38 - 126 U/L - - 77  AST 15 - 41 U/L - - 49(H)  ALT 0 - 44 U/L - - 39    Assessment/Plan:   Principal Problem:   Displaced fracture of greater trochanter of left femur, initial encounter for closed fracture [facility]) Active Problems:   Atrial fibrillation (HCC)   Hypertension   Patient Summary: Lynn Knox is a 87 y.o. with a PMH of COPD, asthma, HLD, HTN, and thyroid disease presenting via EMS after a fall and subsequently found to have a left comminuted greater trochanteric fracture.   Comminuted greater trochanteric fracture Patient recovering well, she is postop day #1 from intramedullary fixation of the left femur. Will get patient to work with PT and determine any rehab needs going forward.  -Weightbearing as tolerated with walker -Norco Q4 PRN  Acute Blood Loss Anemia: Hemoglobin down trended from 11.9 g to 8.7 g postoperatively.  This is likely acute blood loss anemia related to the surgery and the large hematoma on her left side.  Currently no symptoms of anemia, but limited in her exertion today.  Will need to follow-up on symptoms after PT sessions. -No transfusion for now, but will transfuse if she develops symptoms of anemia or hemoglobin trends below 8 g. -We will check reticulocyte count, ferritin, and iron studies.  Atrial Fibrillation Ventricular rate stable.  -Will start anticoagulation with eliquis 2.5mg  bid now that patient is post-op -Discontinue home aspirin to lower bleeding risk -f/u echo results -can consider starting BB if heart rate becomes elevated   COPD/Asthma: Stable respiratory status, not requiring supplemental oxygen -continue home albuterol and  breo ellipta   HTN -holding home lisinopril 25 mg   HLD -continue home pravastatin 20 mg daily   Hypothyroidism TSH WNL -continue home levothyroxine  daily   Anticipated Discharge Location: SNF Barriers to Discharge: Medical optimization following surgery Dispo: Anticipated discharge to 2-3 days pending medical optimization and placement  Ilene Qua, MD Internal Medicine Resident PGY-1 Pager [phone] Please contact the on call pager after 5 pm and on weekends at [phone].

## 2020-11-11 NOTE — NC FL2 (Signed)
[place] MEDICAID FL2 LEVEL OF CARE SCREENING TOOL     IDENTIFICATION  Patient Name: Lynn Knox Birthdate: 04-13-1925 Sex: female Admission Date (Current Location): 11/10/2020  [facility] and [place] Number:  Producer, television/film/video and Address:  [facility], [address], [place], [place] [address]      Provider Number: [id number]  Attending Physician Name and Address:  Tyson Alias, *  Relative Name and Phone Number:       Current Level of Care: Hospital Recommended Level of Care: Skilled Nursing Facility Prior Approval Number:    Date Approved/Denied:   PASRR Number: [id number]  Discharge Plan: SNF    Current Diagnoses: Patient Active Problem List   Diagnosis Date Noted   Displaced fracture of greater trochanter of left femur, initial encounter for closed fracture (HCC) 11/10/2020   Hypertension 11/10/2020   Atrial fibrillation (HCC) 06/23/2020   Noise-induced hearing loss of both ears 06/23/2020   Prediabetes 06/23/2020   Urinary incontinence 06/23/2020   Asthma 05/28/2019    Orientation RESPIRATION BLADDER Height & Weight     Self  Normal Incontinent Weight: 164 lb (74.4 kg) Height:  5\' 1"  (154.9 cm)  BEHAVIORAL SYMPTOMS/MOOD NEUROLOGICAL BOWEL NUTRITION STATUS      Incontinent Diet (See DC summary)  AMBULATORY STATUS COMMUNICATION OF NEEDS Skin   Limited Assist Verbally Normal                       Personal Care Assistance Level of Assistance  Bathing, Feeding, Dressing Bathing Assistance: Limited assistance Feeding assistance: Independent Dressing Assistance: Limited assistance     Functional Limitations Info  Sight, Hearing, Speech Sight Info: Adequate Hearing Info: Impaired (HoH) Speech Info: Adequate    SPECIAL CARE FACTORS FREQUENCY  PT (By licensed PT), OT (By licensed OT)     PT Frequency: 5x a week OT Frequency: 5x a week            Contractures Contractures Info: Not present     Additional Factors Info  Code Status, Allergies Code Status Info: DNR Allergies Info: Doxycyline           Current Medications (11/11/2020):  This is the current hospital active medication list Current Facility-Administered Medications  Medication Dose Route Frequency Provider Last Rate Last Admin   acetaminophen (TYLENOL) tablet 325-650 mg  325-650 mg Oral Q6H PRN Swinteck, 01/12/2021, MD       albuterol (PROVENTIL) (2.5 MG/3ML) 0.083% nebulizer solution 2.5 mg  2.5 mg Inhalation Q6H PRN Swinteck, Arlys John, MD       apixaban Arlys John) tablet 2.5 mg  2.5 mg Oral BID Everlene Balls, RPH       Chlorhexidine Gluconate Cloth 2 % PADS 6 each  6 each Topical Daily Leander Rams, MD   6 each at 11/11/20 1111   docusate sodium (COLACE) capsule 100 mg  100 mg Oral BID 01/12/21, MD   100 mg at 11/11/20 1110   fluticasone furoate-vilanterol (BREO ELLIPTA) 100-25 MCG/INH 1 puff  1 puff Inhalation Daily Swinteck, 01/12/21, MD   1 puff at 11/11/20 0834   HYDROcodone-acetaminophen (NORCO) 7.5-325 MG per tablet 1-2 tablet  1-2 tablet Oral Q4H PRN Swinteck, 09-[birth year]-1976, MD       HYDROcodone-acetaminophen (NORCO/VICODIN) 5-325 MG per tablet 1-2 tablet  1-2 tablet Oral Q4H PRN Arlys John, MD   1 tablet at 11/11/20 0429   levothyroxine (SYNTHROID) tablet 25 mcg  25 mcg Oral QAC breakfast 01/12/21, MD  25 mcg at 11/11/20 4627   magnesium citrate solution 1 Bottle  1 Bottle Oral Once PRN Samson Frederic, MD       menthol-cetylpyridinium (CEPACOL) lozenge 3 mg  1 lozenge Oral PRN Swinteck, Arlys John, MD       Or   phenol (CHLORASEPTIC) mouth spray 1 spray  1 spray Mouth/Throat PRN Swinteck, Arlys John, MD       methocarbamol (ROBAXIN) tablet 500 mg  500 mg Oral Q6H PRN Swinteck, Arlys John, MD       Or   methocarbamol (ROBAXIN) 500 mg in dextrose 5 % 50 mL IVPB  500 mg Intravenous Q6H PRN Swinteck, Arlys John, MD       metoCLOPramide (REGLAN) tablet 5-10 mg  5-10 mg Oral Q8H PRN Swinteck, Arlys John, MD       Or    metoCLOPramide (REGLAN) injection 5-10 mg  5-10 mg Intravenous Q8H PRN Swinteck, Arlys John, MD       montelukast (SINGULAIR) tablet 5 mg  5 mg Oral QHS Swinteck, Arlys John, MD   5 mg at 11/10/20 2100   morphine 2 MG/ML injection 0.5-1 mg  0.5-1 mg Intravenous Q2H PRN Swinteck, Arlys John, MD       mupirocin ointment (BACTROBAN) 2 % 1 application  1 application Nasal BID Samson Frederic, MD   1 application at 11/11/20 1112   ondansetron (ZOFRAN) tablet 4 mg  4 mg Oral Q6H PRN Swinteck, Arlys John, MD       Or   ondansetron (ZOFRAN) injection 4 mg  4 mg Intravenous Q6H PRN Swinteck, Arlys John, MD       polyethylene glycol (MIRALAX / GLYCOLAX) packet 17 g  17 g Oral Daily PRN Swinteck, Arlys John, MD       pravastatin (PRAVACHOL) tablet 20 mg  20 mg Oral Daily Swinteck, Arlys John, MD   20 mg at 11/11/20 1110   senna (SENOKOT) tablet 8.6 mg  1 tablet Oral BID Samson Frederic, MD   8.6 mg at 11/11/20 1110   sorbitol 70 % solution 30 mL  30 mL Oral Daily PRN Samson Frederic, MD         Discharge Medications: Please see discharge summary for a list of discharge medications.  Relevant Imaging Results:  Relevant Lab Results:   Additional Information OJJ:[id number], Covid vaccinated, no booster  Jimmy Picket, [address]

## 2020-11-11 NOTE — Evaluation (Signed)
Occupational Therapy Evaluation Patient Details Name: Lynn Knox MRN: [id number] DOB: 12-Mar-1925 Today's Date: 11/11/2020    History of Present Illness 87 year old  presented to the emergency department with increasing pain of her left hip after a fall several days ago and found to have a minimally displaced fracture of the left greater trochanter. s/p IM nail 11/10/20. Pt with PMH of atrial fibrillation, COPD, hypertension.   Clinical Impression   Pt presents with decline in function and safety with ADLs and ADL mobility with impaired strength, balance and endurance. PTA pt lived at home with her son in a 1st floor apartment and pt was Ind with ADLs/selfcare, cooking and used a RW for mobility. Pt currently requires mod - max with bed mobility, max - total A with LB ADLs, min A with UB ADLs and mod A with mobility using RW. Pt would benefit from acute OT services to address impairments to maximize level of function and safety    Follow Up Recommendations  SNF    Equipment Recommendations  Other (comment) (TBD at next venue of care)    Recommendations for Other Services       Precautions / Restrictions Precautions Precautions: Fall Restrictions Weight Bearing Restrictions: No Other Position/Activity Restrictions: WBAT      Mobility Bed Mobility Overal bed mobility: Needs Assistance Bed Mobility: Supine to Sit;Sit to Supine     Supine to sit: Mod assist Sit to supine: Max assist   General bed mobility comments: mod A with LEs to EOB and to elevate trunk, max A with LEs back onto bed    Transfers Overall transfer level: Needs assistance Equipment used: Rolling walker (2 wheeled) Transfers: Sit to/from Stand Sit to Stand: Mod assist         General transfer comment: VCs hand placement    Balance Overall balance assessment: Needs assistance Sitting-balance support: Feet supported;Single extremity supported Sitting balance-Leahy Scale: Good     Standing balance  support: Bilateral upper extremity supported Standing balance-Leahy Scale: Poor Standing balance comment: relies on BUE support                           ADL either performed or assessed with clinical judgement   ADL Overall ADL's : Needs assistance/impaired Eating/Feeding: Set up;Independent;Sitting   Grooming: Wash/dry hands;Wash/dry face;Min guard;Sitting   Upper Body Bathing: Minimal assistance;Sitting   Lower Body Bathing: Maximal assistance   Upper Body Dressing : Minimal assistance;Sitting   Lower Body Dressing: Total assistance   Toilet Transfer: Moderate assistance;Stand-pivot;Cueing for safety Toilet Transfer Details (indicate cue type and reason): simulated to recliner and back to bed Toileting- Clothing Manipulation and Hygiene: Total assistance       Functional mobility during ADLs: Moderate assistance;Rolling walker;Cueing for safety       Vision Baseline Vision/History: Wears glasses Wears Glasses: At all times Patient Visual Report: No change from baseline       Perception     Praxis      Pertinent Vitals/Pain Pain Assessment: Faces Faces Pain Scale: Hurts even more Pain Location: L hip with movement Pain Descriptors / Indicators: Sore Pain Intervention(s): Limited activity within patient's tolerance;Monitored during session;Premedicated before session;Repositioned;Ice applied     Hand Dominance Right   Extremity/Trunk Assessment Upper Extremity Assessment Upper Extremity Assessment: Generalized weakness   Lower Extremity Assessment Lower Extremity Assessment: Defer to PT evaluation LLE Deficits / Details: knee ext at least 3/5, hip ~2/5 limited by pain LLE Sensation: WNL LLE Coordination:  WNL   Cervical / Trunk Assessment Cervical / Trunk Assessment: Normal   Communication Communication Communication: No difficulties   Cognition Arousal/Alertness: Awake/alert Behavior During Therapy: WFL for tasks  assessed/performed Overall Cognitive Status: Within Functional Limits for tasks assessed                                     General Comments       Exercises General Exercises - Lower Extremity Long Arc Quad: AROM;Left;5 reps;Seated Heel Slides: AAROM;Left;10 reps;Supine   Shoulder Instructions      Home Living Family/patient expects to be discharged to:: Private residence Living Arrangements: Children Available Help at Discharge: Family Type of Home: Apartment Home Access: Level entry     Home Layout: One level     Bathroom Shower/Tub: Chief Strategy Officer: Standard     Home Equipment: Environmental consultant - 2 wheels          Prior Functioning/Environment Level of Independence: Independent with assistive device(s)        Comments: Ind with ADLs/selfcare, cooking, walks with RW, denies prior falls in past 1 year, lives with son who is "has a bad knee and is disabled"        OT Problem List: Decreased strength;Impaired balance (sitting and/or standing);Pain;Decreased activity tolerance;Decreased coordination;Decreased knowledge of use of DME or AE      OT Treatment/Interventions: Self-care/ADL training;Therapeutic exercise;Patient/family education;Balance training;Therapeutic activities;DME and/or AE instruction    OT Goals(Current goals can be found in the care plan section) Acute Rehab OT Goals Patient Stated Goal: go to rehab and go home OT Goal Formulation: With patient Time For Goal Achievement: 11/25/20 Potential to Achieve Goals: Good ADL Goals Pt Will Perform Grooming: with supervision;with set-up;sitting Pt Will Perform Upper Body Bathing: with min guard assist;with supervision;sitting Pt Will Perform Lower Body Bathing: with mod assist;sitting/lateral leans Pt Will Perform Upper Body Dressing: with min guard assist;with supervision;sitting Pt Will Transfer to Toilet: with min assist;with min guard assist;stand pivot transfer;bedside  commode  OT Frequency: Min 2X/week   Barriers to D/C:            Co-evaluation              AM-PAC OT "6 Clicks" Daily Activity     Outcome Measure Help from another person eating meals?: None Help from another person taking care of personal grooming?: A Little Help from another person toileting, which includes using toliet, bedpan, or urinal?: Total Help from another person bathing (including washing, rinsing, drying)?: A Lot Help from another person to put on and taking off regular upper body clothing?: A Little Help from another person to put on and taking off regular lower body clothing?: Total 6 Click Score: 14   End of Session Equipment Utilized During Treatment: Gait belt;Rolling walker  Activity Tolerance: Patient limited by pain;Patient limited by fatigue Patient left: in bed;with call bell/phone within reach;with bed alarm set  OT Visit Diagnosis: Other abnormalities of gait and mobility (R26.89);History of falling (Z91.81);Muscle weakness (generalized) (M62.81);Unsteadiness on feet (R26.81);Pain Pain - Right/Left: Left Pain - part of body: Hip;Leg                Time: 9371-6967 OT Time Calculation (min): 25 min Charges:  OT General Charges $OT Visit: 1 Visit OT Evaluation $OT Eval Moderate Complexity: 1 Mod OT Treatments $Self Care/Home Management : 8-22 mins   Galen Manila 11/11/2020, 2:13  PM

## 2020-11-11 NOTE — TOC Benefit Eligibility Note (Signed)
Patient Advocate Encounter  Insurance verification completed.    The patient is currently admitted and upon discharge could be taking Eliquis 5 mg.  The current 30 day co-pay is, $47.00.   The patient is currently admitted and upon discharge could be taking Xarelto 20 mg.  The current 30 day co-pay is, $47.00.   The patient is insured through [employer] Medicare Part D     Eriyanna Kofoed, CPhT Pharmacy Patient Advocate Specialist [facility] Team Direct Number: [phone]  Fax: [phone]        

## 2020-11-12 ENCOUNTER — Encounter (HOSPITAL_COMMUNITY): Payer: Self-pay | Admitting: Orthopedic Surgery

## 2020-11-12 LAB — BASIC METABOLIC PANEL
Anion gap: 8 (ref 5–15)
BUN: 20 mg/dL (ref 8–23)
CO2: 22 mmol/L (ref 22–32)
Calcium: 7.9 mg/dL — ABNORMAL LOW (ref 8.9–10.3)
Chloride: 104 mmol/L (ref 98–111)
Creatinine, Ser: 1.09 mg/dL — ABNORMAL HIGH (ref 0.44–1.00)
GFR, Estimated: 47 mL/min — ABNORMAL LOW (ref >60)
Glucose, Bld: 130 mg/dL — ABNORMAL HIGH (ref 70–99)
Potassium: 3.4 mmol/L — ABNORMAL LOW (ref 3.5–5.1)
Sodium: 134 mmol/L — ABNORMAL LOW (ref 135–145)

## 2020-11-12 LAB — CBC
HCT: 23.7 % — ABNORMAL LOW (ref 36.0–46.0)
Hemoglobin: 7.9 g/dL — ABNORMAL LOW (ref 12.0–15.0)
MCH: 32 pg (ref 26.0–34.0)
MCHC: 33.3 g/dL (ref 30.0–36.0)
MCV: 96 fL (ref 80.0–100.0)
Platelets: 188 10*3/uL (ref 150–400)
RBC: 2.47 MIL/uL — ABNORMAL LOW (ref 3.87–5.11)
RDW: 14.6 % (ref 11.5–15.5)
WBC: 11.5 10*3/uL — ABNORMAL HIGH (ref 4.0–10.5)
nRBC: 0 % (ref 0.0–0.2)

## 2020-11-12 LAB — IRON AND TIBC
Iron: 24 ug/dL — ABNORMAL LOW (ref 28–170)
Saturation Ratios: 12 % (ref 10.4–31.8)
TIBC: 203 ug/dL — ABNORMAL LOW (ref 250–450)
UIBC: 179 ug/dL

## 2020-11-12 LAB — RETICULOCYTES
Immature Retic Fract: 34.2 % — ABNORMAL HIGH (ref 2.3–15.9)
RBC.: 2.43 MIL/uL — ABNORMAL LOW (ref 3.87–5.11)
Retic Count, Absolute: 97.2 10*3/uL (ref 19.0–186.0)
Retic Ct Pct: 4 % — ABNORMAL HIGH (ref 0.4–3.1)

## 2020-11-12 LAB — FERRITIN: Ferritin: 147 ng/mL (ref 11–307)

## 2020-11-12 LAB — HEMOGLOBIN AND HEMATOCRIT, BLOOD
HCT: 25.9 % — ABNORMAL LOW (ref 36.0–46.0)
Hemoglobin: 8.7 g/dL — ABNORMAL LOW (ref 12.0–15.0)

## 2020-11-12 LAB — ABO/RH: ABO/RH(D): A POS

## 2020-11-12 LAB — PREPARE RBC (CROSSMATCH)

## 2020-11-12 MED ORDER — SODIUM CHLORIDE 0.9 % IV SOLN
250.0000 mg | Freq: Every day | INTRAVENOUS | Status: DC
  Filled 2020-11-12: qty 20

## 2020-11-12 MED ORDER — POTASSIUM CHLORIDE CRYS ER 20 MEQ PO TBCR
40.0000 meq | EXTENDED_RELEASE_TABLET | Freq: Two times a day (BID) | ORAL | Status: AC
  Administered 2020-11-12 (×2): 40 meq via ORAL
  Filled 2020-11-12 (×2): qty 2

## 2020-11-12 MED ORDER — SODIUM CHLORIDE 0.9% IV SOLUTION: Freq: Once | INTRAVENOUS | Status: DC

## 2020-11-12 MED ORDER — SODIUM CHLORIDE 0.9 % IV SOLN: INTRAVENOUS | Status: AC

## 2020-11-12 MED ORDER — SODIUM CHLORIDE 0.9% IV SOLUTION: Freq: Once | INTRAVENOUS | Status: AC

## 2020-11-12 MED ORDER — SODIUM CHLORIDE 0.9 % IV SOLN: 510.0000 mg | Freq: Once | INTRAVENOUS | Status: DC

## 2020-11-12 MED ORDER — SODIUM CHLORIDE 0.9 % IV BOLUS
500.0000 mL | Freq: Once | INTRAVENOUS | Status: AC
  Administered 2020-11-12: 500 mL via INTRAVENOUS

## 2020-11-12 MED ORDER — POTASSIUM CHLORIDE CRYS ER 20 MEQ PO TBCR: 40.0000 meq | EXTENDED_RELEASE_TABLET | Freq: Once | ORAL | Status: DC

## 2020-11-12 MED ORDER — WHITE PETROLATUM EX OINT
TOPICAL_OINTMENT | CUTANEOUS | Status: AC
  Filled 2020-11-12: qty 28.35

## 2020-11-12 MED ORDER — METOPROLOL TARTRATE 25 MG PO TABS
25.0000 mg | ORAL_TABLET | Freq: Two times a day (BID) | ORAL | Status: DC
  Administered 2020-11-12: 25 mg via ORAL
  Filled 2020-11-12: qty 1

## 2020-11-12 NOTE — Discharge Summary (Addendum)
Name: Lynn Knox MRN: [id number] DOB: [birth year] 87 y.o. PCP: Medicine, [facility]  Date of Admission: 11/10/2020  9:34 AM Date of Discharge: 11/13/20 Attending Physician: Tyson Alias, *  Discharge Diagnosis: 1. Comminuted Greater Trochanteric Fracture   Discharge Medications: Allergies as of 11/13/2020       Reactions   Doxycycline    Pt states makes her eye red        Medication List     STOP taking these medications    aspirin EC 81 MG tablet   Atrovent HFA 17 MCG/ACT inhaler Generic drug: ipratropium   lisinopril 20 MG tablet Commonly known as: ZESTRIL       TAKE these medications    albuterol 108 (90 Base) MCG/ACT inhaler Commonly known as: VENTOLIN HFA Inhale 2 puffs into the lungs every 6 (six) hours as needed for wheezing or shortness of breath.   apixaban 2.5 MG Tabs tablet Commonly known as: ELIQUIS Take 1 tablet (2.5 mg total) by mouth 2 (two) times daily.   Chlorhexidine Gluconate Cloth 2 % Pads Apply 6 each topically daily for 5 days.   hydrochlorothiazide 25 MG tablet Commonly known as: HYDRODIURIL Take 25 mg by mouth daily.   HYDROcodone-acetaminophen 7.5-325 MG tablet Commonly known as: NORCO Take 1-2 tablets by mouth every 4 (four) hours as needed for severe pain (pain score 7-10).   ipratropium-albuterol 0.5-2.5 (3) MG/3ML Soln Commonly known as: DUONEB Take 3 mLs by nebulization 4 (four) times daily as needed. What changed: reasons to take this   levothyroxine 25 MCG tablet Commonly known as: SYNTHROID Take 25 mcg by mouth daily.   montelukast 10 MG tablet Commonly known as: SINGULAIR TAKE 1 TABLET(10 MG) BY MOUTH DAILY   Multivitamin Adult Tabs Take 1 tablet by mouth daily.   ondansetron 4 MG tablet Commonly known as: ZOFRAN Take 1 tablet (4 mg total) by mouth every 6 (six) hours as needed for nausea.   pravastatin 20 MG tablet Commonly known as: PRAVACHOL Take 20 mg by mouth daily.    senna 8.6 MG Tabs tablet Commonly known as: SENOKOT Take 1 tablet (8.6 mg total) by mouth 2 (two) times daily.        Disposition and follow-up:   Ms.Anise Dungee was discharged from [facility] in [place] condition.  At the hospital follow up visit please address:  1.  Recovery from Comminuted greater trochanteric fracture repair- assess patient's ability to return to normal ADLs  2. Atrial Fibrillation- discuss results of echo, newly started metoprolol tartrate 25 mg BID and eliquis 2.5 mg BID  3. Consider starting a bisphosphonate 2/2 fragility hip fracture.  3.  Labs / imaging needed at time of follow-up: CBC to assess hemoglobin  3.  Pending labs/ test needing follow-up: None  Follow-up Appointments:  Follow-up Information     Swinteck, Arlys John, MD. Schedule an appointment as soon as possible for a visit in 2 week(s).   Specialty: Orthopedic Surgery Why: For wound re-check Contact information: [address] [place] [address] [phone]                 Hospital Course by problem list: 1. Comminuted greater trochanteric fracture Patient fell at home but was able to still walk so did not come to the hospital immediately. After four days at home patient came in to the hospital and was found to have a fracture on XR. Went for orthopedic repair in the OR on 7/4. Vitamin  D level within normal limits. Continued to work with physical therapy inpatient and will be discharged to continue with therapy at rehab.   Acute Blood Loss Anemia Patient hemoglobin of 11.9 dropped to 8.7 and then 7.9 on 7/6. Patient then received 1 unit pRBC to maintain a hemoglobin of 8 in the setting of recent hip surgery. Hemoglobin then stabilized above goal of 8.  Atrial Fibrillation Patient recently diagnosed with atrial fibrillation by PCP, came in asymptomatic. After surgery patient was started on eliquis 2.5 mg BID for prophylaxis. Metoprolol tartrate 25mg   started on 7/6 due to elevated heart rate but discontinued the same day due to subsequent drop in blood pressure.  COPD/Asthma Patient remained asymptomatic, with no supplement oxygen requirement continued home albuterol and breo ellipta.  Essential Hypertension Home lisinopril 25 mg held while inpatient.  Hyperlipidemia Home pravastain 20 mg continued.  Hypothyroidism TSH was within normal limited, continued home levothyroxine 25 mcg daily.   Discharge Exam:   BP (!) 93/51   Pulse 93   Temp 98.2 F (36.8 C) (Oral)   Resp 20   Ht 5\' 1"  (1.549 m)   Wt 74.4 kg   SpO2 98%   BMI 30.99 kg/m  Discharge exam:  Constitutional: sleepy elderly woman lying in bed, in no acute distress Pulmonary/Chest: normal work of breathing on room air MSK: normal bulk and tone Neurological: alert oriented to self and place with prompting, answers questions appropriately Skin: incisions c/d/i, increased ecchymosis on the lateral thigh   Pertinent Labs, Studies, and Procedures:   Echocardiogram complete IMPRESSIONS   1. Left ventricular ejection fraction, by estimation, is 65 to 70%. The  left ventricle has normal function. The left ventricle has no regional  wall motion abnormalities. There is mild concentric left ventricular  hypertrophy of the basal-septal segment.  Left ventricular diastolic parameters are indeterminate.   2. Right ventricular systolic function is normal. The right ventricular  size is normal. There is mildly elevated pulmonary artery systolic  pressure. The estimated right ventricular systolic pressure is 39.2 mmHg.   3. The mitral valve is abnormal. Mild mitral valve regurgitation. The  mean mitral valve gradient is 3.4 mmHg with average heart rate of 118 bpm.  Severe mitral annular calcification.   4. Tricuspid valve regurgitation is mild to moderate.   5. The aortic valve is tricuspid. There is mild calcification of the  aortic valve. There is moderate thickening of  the aortic valve. Aortic  valve regurgitation is mild to moderate. Mild aortic valve sclerosis is  present, with no evidence of aortic valve   stenosis. Aortic regurgitation PHT measures 321 msec.   6. The inferior vena cava is normal in size with greater than 50%  respiratory variability, suggesting right atrial pressure of 3 mmHg.  Vit D, 25-Hydroxy 30 - 100 ng/mL 33.98   Iron 28 - 170 ug/dL 24 Low    TIBC 9/6 - ug/dL 681 Low    Saturation Ratios 10.4 - 31.8 % 12   UIBC ug/dL 157    Retic Ct Pct 0.4 - 3.1 % 4.0 High    RBC. 3.87 - 5.11 MIL/uL 2.43 Low    Retic Count, Absolute 19.0 - 186.0 K/uL 97.2   Immature Retic Fract 2.3 - 15.9 % 34.2 High     Ferritin 11 - 307 ng/mL 147    TSH 0.350 - 4.500 uIU/mL 1.853    CBC Latest Ref Rng & Units 11/13/2020 11/12/2020 11/12/2020  WBC 4.0 - 10.5 K/uL  11.7(H) - 11.5(H)  Hemoglobin 12.0 - 15.0 g/dL 3.0(Q) 6.5(H) 7.9(L)  Hematocrit 36.0 - 46.0 % 25.0(L) 25.9(L) 23.7(L)  Platelets 150 - 400 K/uL 205 - 188   Lab Results  Component Value Date   NA 135 11/13/2020   K 4.4 11/13/2020   CO2 22 11/13/2020   GLUCOSE 131 (H) 11/13/2020   BUN 22 11/13/2020   CREATININE 1.06 (H) 11/13/2020   CALCIUM 7.9 (L) 11/13/2020   GFRNONAA 49 (L) 11/13/2020   GFRAA 53 (L) 05/11/2019    Discharge Instructions:   Signed: Ilene Qua, MD 11/12/2020, 1:00 PM   Pager: [phone]

## 2020-11-12 NOTE — TOC Progression Note (Addendum)
Transition of Care [facility]) - Progression Note    Patient Details  Name: Lynn Knox MRN: [id number] Date of Birth: November 29, 1924  Transition of Care [facility]) CM/SW Contact  Erin Sons, [place] Phone Number: 11/12/2020, 3:28 PM  Clinical Narrative:     Pt with pt bedside and called pt grandson on speaker phone. CSW reiterated PT recs for SNF. Lucila Maine states that would be most appropriate as his father is unable to physically take care of pt. Pt is agreeable. CSW emails grandson at [email or web address] a list of SNF offers and medicare ratings. Lucila Maine states he discussed with his father and choose [facility]. CSW confirmed with [facility] that they can take pt tomorrow. CSW to start auth. Pt is vaccinated x2 (no booster but willingt to get it).    1610: CSW submitted auth on [employer] portal. Auth instantly approved for  11/12/2020-11/14/2020 [id number]   Expected Discharge Plan: Skilled Nursing Facility Barriers to Discharge: Continued Medical Work up, English as a second language teacher  Expected Discharge Plan and Services Expected Discharge Plan: Skilled Nursing Facility       Living arrangements for the past 2 months: Single Family Home                                       Social Determinants of Health (SDOH) Interventions    Readmission Risk Interventions No flowsheet data found.

## 2020-11-12 NOTE — Progress Notes (Signed)
Noted hypotensive episodes on chart review with a note stating that rapid was at bedside to place an IV.  IMTS came to bedside for evaluation.  Patient sitting in bed comfortably, with nurse at bedside.  Nurse states that she came into the patient's room and noticed that her IV was leaking, and that her blood pressures were in the 80s/50s with a heart rate in the mid 90s, and was saturating in the 80s.  She spoke with rapid response nurse who agreed that the hypotension may be secondary to 25 mg Lopressor.  IV was placed and patient given supplemental O2.  Physical Exam Constitutional:      General: She is not in acute distress.    Appearance: She is not ill-appearing, toxic-appearing or diaphoretic.     Comments: Answering questions appropriately.   Cardiovascular:     Rate and Rhythm: Tachycardia present. Rhythm irregular.     Heart sounds: No murmur heard.   No friction rub. No gallop.     Comments: Irregularly irregular pulse Dorsalis pedis pulses intact bilaterally Pulmonary:     Effort: No respiratory distress.     Breath sounds: Normal breath sounds. No wheezing, rhonchi or rales.  Abdominal:     General: Abdomen is flat. Bowel sounds are normal.     Palpations: Abdomen is soft.     Tenderness: There is no abdominal tenderness. There is no guarding.  Skin:    Coloration: Skin is not jaundiced or pale.     Findings: Bruising present. No rash.     Comments: Medial thigh ecchymosis stable, no tenderness to palpitation. No new eccymosis present at the surgical site. No drainage, purulence, erythema at the surgical site.   Neurological:     Mental Status: She is alert.     Comments: Alert to self, place, month    A/P: Patient appears at baseline.  Denies any symptoms of headaches, nausea, vomiting, lightheadedness, dizziness, shortness of breath, dyspnea, chest pain, abdominal pain, new skin rashes, or painful extremities.  Repeat blood pressure showing 106/52 with a pulse of 110.   Physical examination is unremarkable at this time. May be likely secondary to Lopressor which was given at approximately 1300.  Discussed findings with RN, to page IMTS if another event occurs. - Continue to monitor vitals - Normal saline 500 mL bolus followed by normal saline infusion 100 cc an hour for 5 hours. - Discontinue Lopressor  Dolan Amen, MD IMTS, PGY-3 Pager: [phone] 11/12/2020,8:56 PM

## 2020-11-12 NOTE — Progress Notes (Signed)
Updated Dr Ephriam Knuckles on patient's status. Improved BP, HR still in 90s. Continuous fluids infusing. Lopressor DC'd

## 2020-11-12 NOTE — Progress Notes (Signed)
Rapid Response present and has placed IV and IV team no longer needed.

## 2020-11-12 NOTE — Progress Notes (Signed)
Patient seen with blood on her gown and blanket and seemingly gasping for air. VS showed BP 80s/50s HR 90s O2 sat high 70s-80s. No crackles, wheezes, or rhonchi noted on auscultation or pitting edema in extremities. IV site is leaking. Paused PRBCs and attempted twice to stick patient and IV consult entered. I contacted RR RN for another set of eyes. She agrees that patient may have had hypotension r/t her lopressor from this AM. Will continue to monitor her BP q15 minutes until blood is complete. RR RN able to get new IV and blood administration resumed. Secure chat sent to [facility], A.

## 2020-11-12 NOTE — Progress Notes (Signed)
HD#2 Subjective:   Patient says she is doing ok this morning. When asked about her hip she said "I guess it's still there". Says that she has been told she was anemic in the past.   Objective:  Vital signs in last 24 hours: Vitals:   11/11/20 1304 11/11/20 2011 11/12/20 0509 11/12/20 0801  BP: 138/60 (!) 106/52 (!) 101/45   Pulse: 91 (!) 110 95   Resp: 19 16 16 16   Temp: 97.8 F (36.6 C) 99.2 F (37.3 C) 98.2 F (36.8 C)   TempSrc: Axillary Oral Oral   SpO2: 97% 100% 95%   Weight:      Height:       Supplemental O2: Room Air SpO2: 95 % O2 Flow Rate (L/min): 6 L/min   Physical Exam:  Constitutional: elderly woman lying in bed, in no acute distress Neurological: alert, answers questions appropriately, MSK: moves extremities spontaneously Skin: incision c/d/I on the left lateral thigh, ecchymosis present on the medial thigh   Filed Weights   11/10/20 1515  Weight: 74.4 kg     Intake/Output Summary (Last 24 hours) at 11/12/2020 1013 Last data filed at 11/12/2020 1005 Gross per 24 hour  Intake 450 ml  Output 900 ml  Net -450 ml   Net IO Since Admission: 250 mL [11/12/20 1013]  Pertinent Labs: CBC Latest Ref Rng & Units 11/12/2020 11/11/2020 11/10/2020  WBC 4.0 - 10.5 K/uL 11.5(H) 10.4 12.6(H)  Hemoglobin 12.0 - 15.0 g/dL 7.9(L) 8.7(L) 10.3(L)  Hematocrit 36.0 - 46.0 % 23.7(L) 26.3(L) 31.1(L)  Platelets 150 - 400 K/uL 188 217 241    CMP Latest Ref Rng & Units 11/12/2020 11/11/2020 11/10/2020  Glucose 70 - 99 mg/dL 01/11/2021) 841(Y) -  BUN 8 - 23 mg/dL 20 23 -  Creatinine 606(T - 1.00 mg/dL 0.16) 0.10(X 3.23  Sodium 135 - 145 mmol/L 134(L) 137 -  Potassium 3.5 - 5.1 mmol/L 3.4(L) 3.7 -  Chloride 98 - 111 mmol/L 104 103 -  CO2 22 - 32 mmol/L 22 28 -  Calcium 8.9 - 10.3 mg/dL 7.9(L) 8.4(L) -  Total Protein 6.5 - 8.1 g/dL - - -  Total Bilirubin 0.3 - 1.2 mg/dL - - -  Alkaline Phos 38 - 126 U/L - - -  AST 15 - 41 U/L - - -  ALT 0 - 44 U/L - - -     Imaging: ECHOCARDIOGRAM COMPLETE  Result Date: 11/11/2020    ECHOCARDIOGRAM REPORT   Patient Name:   [facility] Date of Exam: 11/11/2020 Medical Rec #:  01/12/2021  Height:       61.0 in Accession #:    [id number] Weight:       164.0 lb Date of Birth:  06-28-1924  BSA:          1.736 m Patient Age:    87 years   BP:           138/50 mmHg Patient Gender: F          HR:           120 bpm. Exam Location:  Inpatient Procedure: 2D Echo, Cardiac Doppler and Color Doppler Indications:    Atrial fibrillation  History:        Patient has no prior history of Echocardiogram examinations.                 Arrythmias:Atrial Fibrillation; Risk Factors:Hypertension.  Sonographer:    05/29/1924 RDCS (AE) Referring Phys: Ross Ludwig [facility]  VINCENT  Sonographer Comments: Limited patient mobility to recent left hip surgery. Patient supine. IMPRESSIONS  1. Left ventricular ejection fraction, by estimation, is 65 to 70%. The left ventricle has normal function. The left ventricle has no regional wall motion abnormalities. There is mild concentric left ventricular hypertrophy of the basal-septal segment. Left ventricular diastolic parameters are indeterminate.  2. Right ventricular systolic function is normal. The right ventricular size is normal. There is mildly elevated pulmonary artery systolic pressure. The estimated right ventricular systolic pressure is 39.2 mmHg.  3. The mitral valve is abnormal. Mild mitral valve regurgitation. The mean mitral valve gradient is 3.4 mmHg with average heart rate of 118 bpm. Severe mitral annular calcification.  4. Tricuspid valve regurgitation is mild to moderate.  5. The aortic valve is tricuspid. There is mild calcification of the aortic valve. There is moderate thickening of the aortic valve. Aortic valve regurgitation is mild to moderate. Mild aortic valve sclerosis is present, with no evidence of aortic valve  stenosis. Aortic regurgitation PHT measures 321 msec.  6. The inferior vena  cava is normal in size with greater than 50% respiratory variability, suggesting right atrial pressure of 3 mmHg. Comparison(s): No prior Echocardiogram. FINDINGS  Left Ventricle: Left ventricular ejection fraction, by estimation, is 65 to 70%. The left ventricle has normal function. The left ventricle has no regional wall motion abnormalities. The left ventricular internal cavity size was small. There is mild concentric left ventricular hypertrophy of the basal-septal segment. Left ventricular diastolic parameters are indeterminate. Right Ventricle: The right ventricular size is normal. No increase in right ventricular wall thickness. Right ventricular systolic function is normal. There is mildly elevated pulmonary artery systolic pressure. The tricuspid regurgitant velocity is 3.01  m/s, and with an assumed right atrial pressure of 3 mmHg, the estimated right ventricular systolic pressure is 39.2 mmHg. Left Atrium: Left atrial size was normal in size. Right Atrium: Right atrial size was normal in size. Pericardium: There is no evidence of pericardial effusion. Mitral Valve: The mitral valve is abnormal. Severe mitral annular calcification. Mild mitral valve regurgitation. The mean mitral valve gradient is 3.4 mmHg with average heart rate of 118 bpm. Tricuspid Valve: The tricuspid valve is normal in structure. Tricuspid valve regurgitation is mild to moderate. No evidence of tricuspid stenosis. Aortic Valve: The aortic valve is tricuspid. There is mild calcification of the aortic valve. There is moderate thickening of the aortic valve. Aortic valve regurgitation is mild to moderate. Aortic regurgitation PHT measures 321 msec. Mild aortic valve sclerosis is present, with no evidence of aortic valve stenosis. Aortic valve mean gradient measures 4.0 mmHg. Aortic valve peak gradient measures 7.2 mmHg. Aortic valve area, by VTI measures 1.58 cm. Pulmonic Valve: The pulmonic valve was not well visualized. Pulmonic valve  regurgitation is not visualized. Aorta: The aortic root and ascending aorta are structurally normal, with no evidence of dilitation. Venous: The inferior vena cava is normal in size with greater than 50% respiratory variability, suggesting right atrial pressure of 3 mmHg. IAS/Shunts: The atrial septum is grossly normal.  LEFT VENTRICLE PLAX 2D LVIDd:         3.60 cm LVIDs:         2.10 cm LV PW:         1.20 cm LV IVS:        1.40 cm LVOT diam:     1.70 cm LV SV:         32 LV SV Index:   19  LVOT Area:     2.27 cm  RIGHT VENTRICLE             IVC RV Basal diam:  3.60 cm     IVC diam: 1.40 cm RV Mid diam:    2.80 cm RV S prime:     14.60 cm/s TAPSE (M-mode): 1.5 cm LEFT ATRIUM           Index       RIGHT ATRIUM           Index LA diam:      4.10 cm 2.36 cm/m  RA Area:     18.80 cm LA Vol (A4C): 37.2 ml 21.43 ml/m RA Volume:   46.60 ml  26.84 ml/m  AORTIC VALVE AV Area (Vmax):    1.59 cm AV Area (Vmean):   1.46 cm AV Area (VTI):     1.58 cm AV Vmax:           134.00 cm/s AV Vmean:          92.300 cm/s AV VTI:            0.204 m AV Peak Grad:      7.2 mmHg AV Mean Grad:      4.0 mmHg LVOT Vmax:         94.00 cm/s LVOT Vmean:        59.300 cm/s LVOT VTI:          0.142 m LVOT/AV VTI ratio: 0.70 AI PHT:            321 msec  AORTA Ao Root diam: 3.30 cm Ao Asc diam:  3.30 cm MITRAL VALVE           TRICUSPID VALVE MV Mean grad: 3.4 mmHg TR Peak grad:   36.2 mmHg                        TR Vmax:        301.00 cm/s                         SHUNTS                        Systemic VTI:  0.14 m                        Systemic Diam: 1.70 cm Riley Lam MD Electronically signed by Riley Lam MD Signature Date/Time: 11/11/2020/5:09:44 PM    Final     Assessment/Plan:   Principal Problem:   Displaced fracture of greater trochanter of left femur, initial encounter for closed fracture [facility]) Active Problems:   Atrial fibrillation (HCC)   Hypertension   Patient Summary: Lynn Knox is a 87 y.o.  with a  PMH of COPD, asthma, HLD, HTN, and thyroid disease presenting via EMS after a fall and subsequently found to have a left comminuted greater trochanteric fracture.   Comminuted greater trochanteric fracture Two days post-op from repair. Will discuss social situation and patient's feelings on going to rehab before returning home per PT recommendations. -weight bearing as tolerated with walker -Norco Q4 PRN  Acute Blood Loss Anemia:  Patient's hemoglobin dropped after surgery from 11.9 to 8.7 to 7.9 on 7/6. Will transfuse today to maintain Hemglobin >8 in setting of recent surgery and anticoagulation. Patient consented at bedside -transfuse 1u pRBC -type and screen -iron deficiency ruled out,  ferritin 147  Atrial Fibrillation Heart rate elevated today between 91-110. Echo showed LVEF 65-70, valvular disease noted -start metoprolol tartrate 25 mg BID -continue eliquis 2.5 mg BID -can discuss echo further with PCP as an outpatient  COPD/asthma Stable respiratory status, not requiring supplemental O2 -continue home albuterol and breo ellipta  HTN -holding home lisinopril 25 mg  HLD -continue home pravastatin 20 mg daily  Hypothyroidism TSH WNL -continue home levothyrozine 25 mcg daily  Prior to Admission Living Arrangement: Home Anticipated Discharge Location: SNF Barriers to Discharge: Medication optimization  Dispo: Anticipated discharge to 1-2 days pending SNF placement  Ilene Qua, MD Internal Medicine Resident PGY-1 Pager [phone] Please contact the on call pager after 5 pm and on weekends at [phone].

## 2020-11-12 NOTE — Progress Notes (Signed)
Ok to change Feraheme to Ferrlecit 250mg  IV q24 x2 due to formulary reason per Dr. , PharmD, BCIDP, AAHIVP, CPP Infectious Disease Pharmacist 11/12/2020 10:14 AM

## 2020-11-13 LAB — BASIC METABOLIC PANEL
Anion gap: 5 (ref 5–15)
BUN: 22 mg/dL (ref 8–23)
CO2: 22 mmol/L (ref 22–32)
Calcium: 7.9 mg/dL — ABNORMAL LOW (ref 8.9–10.3)
Chloride: 108 mmol/L (ref 98–111)
Creatinine, Ser: 1.06 mg/dL — ABNORMAL HIGH (ref 0.44–1.00)
GFR, Estimated: 49 mL/min — ABNORMAL LOW (ref >60)
Glucose, Bld: 131 mg/dL — ABNORMAL HIGH (ref 70–99)
Potassium: 4.4 mmol/L (ref 3.5–5.1)
Sodium: 135 mmol/L (ref 135–145)

## 2020-11-13 LAB — TYPE AND SCREEN
ABO/RH(D): A POS
Antibody Screen: NEGATIVE
Unit division: 0

## 2020-11-13 LAB — CBC
HCT: 25 % — ABNORMAL LOW (ref 36.0–46.0)
Hemoglobin: 8.4 g/dL — ABNORMAL LOW (ref 12.0–15.0)
MCH: 32.1 pg (ref 26.0–34.0)
MCHC: 33.6 g/dL (ref 30.0–36.0)
MCV: 95.4 fL (ref 80.0–100.0)
Platelets: 205 10*3/uL (ref 150–400)
RBC: 2.62 MIL/uL — ABNORMAL LOW (ref 3.87–5.11)
RDW: 16.8 % — ABNORMAL HIGH (ref 11.5–15.5)
WBC: 11.7 10*3/uL — ABNORMAL HIGH (ref 4.0–10.5)
nRBC: 0 % (ref 0.0–0.2)

## 2020-11-13 LAB — BPAM RBC
Blood Product Expiration Date: 202207262359
ISSUE DATE / TIME: 202207061213
Unit Type and Rh: 6200

## 2020-11-13 LAB — SARS CORONAVIRUS 2 (TAT 6-24 HRS): SARS Coronavirus 2: NEGATIVE

## 2020-11-13 MED ORDER — CHLORHEXIDINE GLUCONATE CLOTH 2 % EX PADS: 6.0000 | MEDICATED_PAD | Freq: Every day | CUTANEOUS | 0 refills | Status: AC

## 2020-11-13 MED ORDER — APIXABAN 2.5 MG PO TABS: 2.5000 mg | ORAL_TABLET | Freq: Two times a day (BID) | ORAL | 0 refills | Status: DC

## 2020-11-13 MED ORDER — ONDANSETRON HCL 4 MG PO TABS: 4.0000 mg | ORAL_TABLET | Freq: Four times a day (QID) | ORAL | 0 refills | Status: DC | PRN

## 2020-11-13 MED ORDER — SENNA 8.6 MG PO TABS: 1.0000 | ORAL_TABLET | Freq: Two times a day (BID) | ORAL | 0 refills | Status: DC

## 2020-11-13 NOTE — TOC Transition Note (Signed)
Transition of Care [facility]) - CM/SW Discharge Note   Patient Details  Name: Lynn Knox MRN: [id number] Date of Birth: 1924/05/24  Transition of Care [facility]) CM/SW Contact:  Erin Sons, LCSW Phone Number: 11/13/2020, 1:33 PM   Clinical Narrative:     Patient will DC to: [facility] Anticipated DC date: 11/13/20 Family notified:   Lawanna Kobus [facility])  [phone] [facility])   Transport by: Sharin Mons   Per MD patient ready for DC to [facility] [place]. RN, patient, patient's family, and facility notified of DC. Discharge Summary and FL2 sent to facility. RN to call report prior to discharge [phone]). DC packet on chart. Ambulance transport requested for patient.   CSW will sign off for now as social work intervention is no longer needed. Please consult [place] again if new needs arise.   Final next level of care: Skilled Nursing Facility Barriers to Discharge: No Barriers Identified   Patient Goals and CMS Choice Patient states their goals for this hospitalization and ongoing recovery are:: to get stronger CMS Medicare.gov Compare Post Acute Care list provided to:: Patient Choice offered to / list presented to : Patient  Discharge Placement              Patient chooses bed at:  ([facility] [place]) Patient to be transferred to facility by: [facility]AR Name of family member notified: Lawanna Kobus [facility])   [phone] [facility]) Patient and family notified of of transfer: 11/13/20  Discharge Plan and Services                                     Social Determinants of Health (SDOH) Interventions     Readmission Risk Interventions No flowsheet data found.

## 2020-11-13 NOTE — Plan of Care (Signed)
  Problem: Education: Goal: Knowledge of General Education information will improve Description: Including pain rating scale, medication(s)/side effects and non-pharmacologic comfort measures 11/13/2020 1318 by Garlon Hatchet A, RN Outcome: Progressing 11/13/2020 1318 by Quintella Baton, RN Outcome: Progressing   Problem: Health Behavior/Discharge Planning: Goal: Ability to manage health-related needs will improve Outcome: Progressing   Problem: Clinical Measurements: Goal: Ability to maintain clinical measurements within normal limits will improve Outcome: Progressing Goal: Will remain free from infection Outcome: Progressing Goal: Diagnostic test results will improve Outcome: Progressing Goal: Respiratory complications will improve Outcome: Progressing Goal: Cardiovascular complication will be avoided Outcome: Progressing   Problem: Activity: Goal: Risk for activity intolerance will decrease Outcome: Progressing   Problem: Nutrition: Goal: Adequate nutrition will be maintained Outcome: Progressing   Problem: Coping: Goal: Level of anxiety will decrease Outcome: Progressing   Problem: Elimination: Goal: Will not experience complications related to bowel motility Outcome: Progressing Goal: Will not experience complications related to urinary retention Outcome: Progressing   Problem: Pain Managment: Goal: General experience of comfort will improve Outcome: Progressing   Problem: Safety: Goal: Ability to remain free from injury will improve Outcome: Progressing   Problem: Skin Integrity: Goal: Risk for impaired skin integrity will decrease Outcome: Progressing

## 2020-11-13 NOTE — Progress Notes (Signed)
Report given to [facility] RN in SNF at [phone]

## 2020-11-13 NOTE — Progress Notes (Signed)
HD#3 Subjective:  Overnight Events: Patient seen at bedside by IMTS for report of new oxygen requirement and low blood pressures running in the 80s systolic. Was given a fluid bolus and put on 5 hrs of maintenance fluids at that time and lopressor was d/c.   Patient a little bit more confused this morning, says that she is feeling sleepy. When asked if she knew where she was she said no, but when she was told she was in the hospital she remembered that she came in because she hurt her bottom. Says that she has not really been getting out of bed much yet but nodded yes when interviewer discussed importance of getting up and moving after a hip fracture.  Objective:  Vital signs in last 24 hours: Vitals:   11/12/20 1950 11/13/20 0525 11/13/20 0745 11/13/20 0914  BP: (!) 107/48 (!) 116/48 104/78   Pulse: 98 (!) 52 61 97  Resp: (!) 22 18 18 16   Temp:  (!) 97.5 F (36.4 C) 98.2 F (36.8 C)   TempSrc: Oral Oral Oral   SpO2: 100% 96%  97%  Weight:      Height:       Supplemental O2: Room Air SpO2: 97 % O2 Flow Rate (L/min): 2 L/min   Physical Exam:  Constitutional: sleepy elderly woman lying in bed, in no acute distress Pulmonary/Chest: normal work of breathing on room air MSK: normal bulk and tone Neurological: alert oriented to self and place with prompting, answers questions appropriately Skin: incisions c/d/i, increased ecchymosis on the lateral thigh   Filed Weights   11/10/20 1515  Weight: 74.4 kg     Intake/Output Summary (Last 24 hours) at 11/13/2020 1117 Last data filed at 11/13/2020 0742 Gross per 24 hour  Intake 1535.02 ml  Output 100 ml  Net 1435.02 ml   Net IO Since Admission: 1,685.02 mL [11/13/20 1117]  Pertinent Labs: CBC Latest Ref Rng & Units 11/13/2020 11/12/2020 11/12/2020  WBC 4.0 - 10.5 K/uL 11.7(H) - 11.5(H)  Hemoglobin 12.0 - 15.0 g/dL 01/13/2021) 6.0(V) 7.9(L)  Hematocrit 36.0 - 46.0 % 25.0(L) 25.9(L) 23.7(L)  Platelets 150 - 400 K/uL 205 - 188    CMP  Latest Ref Rng & Units 11/13/2020 11/12/2020 11/11/2020  Glucose 70 - 99 mg/dL 01/12/2021) 062(I) 948(N)  BUN 8 - 23 mg/dL 22 20 23   Creatinine 0.44 - 1.00 mg/dL 462(V) ) 0.35(K  Sodium 135 - 145 mmol/L 135 134(L) 137  Potassium 3.5 - 5.1 mmol/L 4.4 3.4(L) 3.7  Chloride 98 - 111 mmol/L 108 104 103  CO2 22 - 32 mmol/L 22 22 28   Calcium 8.9 - 10.3 mg/dL 7.9(L) 7.9(L) 8.4(L)  Total Protein 6.5 - 8.1 g/dL - - -  Total Bilirubin 0.3 - 1.2 mg/dL - - -  Alkaline Phos 38 - 126 U/L - - -  AST 15 - 41 U/L - - -  ALT 0 - 44 U/L - - -    Imaging: No results found.  Assessment/Plan:   Principal Problem:   Displaced fracture of greater trochanter of left femur, initial encounter for closed fracture [facility]) Active Problems:   Atrial fibrillation (HCC)   Hypertension   Patient Summary: Lynn Knox is a 87 y.o. with a PMH of COPD, asthma, HLD, HTN, and thyroid disease presenting via EMS after a fall and subsequently found to have a left comminuted greater trochanteric fracture.  Comminuted greater trochanteric fracture Three days post-op from repair. Patient amenable to going to rehab to  work on getting stronger. -weight bearing as tolerated with walker -Norco Q4 PRN  Atrial Fibrillation Echo showed LVEF 65-70, valvular disease noted. -metoprolol d/c 7/6 due to hypotension -continue eliquis 2.5 mg BID -can discuss echo further with PCP as an outpatient  Acute Blood Loss Anemia: Improved S/p 1 1U pRBC 7/6. Maintaining hemoglobin goal of >8.  COPD/asthma Stable respiratory status, not requiring supplemental O2 -continue home albuterol and breo ellipta   HTN -holding home lisinopril 25 mg   HLD -continue home pravastatin 20 mg daily   Hypothyroidism TSH WNL -continue home levothyrozine 25 mcg daily    Prior to Admission Living Arrangement: Home Anticipated Discharge Location: SNF Barriers to Discharge: Medically Stable for discharge Dispo: Anticipated discharge in 1-2 days pending  placement  Ilene Qua, MD Internal Medicine Resident PGY-1 Pager [phone] Please contact the on call pager after 5 pm and on weekends at [phone].

## 2020-11-13 NOTE — Progress Notes (Signed)
Lynn Knox to be D/C'd  per MD order.  Discussed with the patient and all questions fully answered.  VSS, Skin clean, dry and intact without evidence of skin break down, no evidence of skin tears noted.  IV catheter discontinued intact. Site without signs and symptoms of complications. Dressing and pressure applied.  An After Visit Summary was printed and given to the patient. Patient received prescription.  D/c education completed with patient/family including follow up instructions, medication list, d/c activities limitations if indicated, with other d/c instructions as indicated by MD - patient able to verbalize understanding, all questions fully answered.   Patient instructed to return to ED, call 911, or call MD for any changes in condition.   Patient to be escorted via WC, and D/C home via private auto.

## 2020-11-13 NOTE — Progress Notes (Signed)
Trial to wean from oxygen failed. Pt discharged on 2 L [place]

## 2020-11-13 NOTE — Plan of Care (Signed)
Problem: Education: Goal: Knowledge of General Education information will improve Description: Including pain rating scale, medication(s)/side effects and non-pharmacologic comfort measures 11/13/2020 1318 by Quintella Baton, RN Outcome: Adequate for Discharge 11/13/2020 1318 by Quintella Baton, RN Outcome: Progressing 11/13/2020 1318 by Quintella Baton, RN Outcome: Progressing   Problem: Health Behavior/Discharge Planning: Goal: Ability to manage health-related needs will improve 11/13/2020 1318 by Quintella Baton, RN Outcome: Adequate for Discharge 11/13/2020 1318 by Quintella Baton, RN Outcome: Progressing 11/13/2020 1318 by Quintella Baton, RN Outcome: Progressing   Problem: Clinical Measurements: Goal: Ability to maintain clinical measurements within normal limits will improve 11/13/2020 1318 by Quintella Baton, RN Outcome: Adequate for Discharge 11/13/2020 1318 by Quintella Baton, RN Outcome: Progressing 11/13/2020 1318 by Quintella Baton, RN Outcome: Progressing Goal: Will remain free from infection 11/13/2020 1318 by Quintella Baton, RN Outcome: Adequate for Discharge 11/13/2020 1318 by Quintella Baton, RN Outcome: Progressing 11/13/2020 1318 by Quintella Baton, RN Outcome: Progressing Goal: Diagnostic test results will improve 11/13/2020 1318 by Quintella Baton, RN Outcome: Adequate for Discharge 11/13/2020 1318 by Quintella Baton, RN Outcome: Progressing 11/13/2020 1318 by Quintella Baton, RN Outcome: Progressing Goal: Respiratory complications will improve 11/13/2020 1318 by Quintella Baton, RN Outcome: Adequate for Discharge 11/13/2020 1318 by Quintella Baton, RN Outcome: Progressing 11/13/2020 1318 by Garlon Hatchet A, RN Outcome: Progressing Goal: Cardiovascular complication will be avoided 11/13/2020 1318 by Quintella Baton, RN Outcome: Adequate for Discharge 11/13/2020 1318 by Quintella Baton, RN Outcome: Progressing 11/13/2020 1318 by Quintella Baton, RN Outcome:  Progressing   Problem: Activity: Goal: Risk for activity intolerance will decrease 11/13/2020 1318 by Quintella Baton, RN Outcome: Adequate for Discharge 11/13/2020 1318 by Quintella Baton, RN Outcome: Progressing 11/13/2020 1318 by Quintella Baton, RN Outcome: Progressing   Problem: Nutrition: Goal: Adequate nutrition will be maintained 11/13/2020 1318 by Quintella Baton, RN Outcome: Adequate for Discharge 11/13/2020 1318 by Quintella Baton, RN Outcome: Progressing 11/13/2020 1318 by Quintella Baton, RN Outcome: Progressing   Problem: Coping: Goal: Level of anxiety will decrease 11/13/2020 1318 by Quintella Baton, RN Outcome: Adequate for Discharge 11/13/2020 1318 by Quintella Baton, RN Outcome: Progressing 11/13/2020 1318 by Quintella Baton, RN Outcome: Progressing   Problem: Elimination: Goal: Will not experience complications related to bowel motility 11/13/2020 1318 by Quintella Baton, RN Outcome: Adequate for Discharge 11/13/2020 1318 by Quintella Baton, RN Outcome: Progressing 11/13/2020 1318 by Quintella Baton, RN Outcome: Progressing Goal: Will not experience complications related to urinary retention 11/13/2020 1318 by Quintella Baton, RN Outcome: Adequate for Discharge 11/13/2020 1318 by Quintella Baton, RN Outcome: Progressing 11/13/2020 1318 by Quintella Baton, RN Outcome: Progressing   Problem: Pain Managment: Goal: General experience of comfort will improve 11/13/2020 1318 by Quintella Baton, RN Outcome: Adequate for Discharge 11/13/2020 1318 by Quintella Baton, RN Outcome: Progressing 11/13/2020 1318 by Garlon Hatchet A, RN Outcome: Progressing   Problem: Safety: Goal: Ability to remain free from injury will improve 11/13/2020 1318 by Quintella Baton, RN Outcome: Adequate for Discharge 11/13/2020 1318 by Quintella Baton, RN Outcome: Progressing 11/13/2020 1318 by Garlon Hatchet A, RN Outcome: Progressing   Problem: Skin Integrity: Goal: Risk for impaired skin  integrity will decrease 11/13/2020 1318 by Quintella Baton, RN Outcome: Adequate for Discharge 11/13/2020 1318 by Quintella Baton, RN Outcome: Progressing 11/13/2020 1318 by Quintella Baton, RN  Outcome: Progressing   

## 2021-12-06 ENCOUNTER — Emergency Department (HOSPITAL_COMMUNITY)
Admission: EM | Admit: 2021-12-06 | Discharge: 2021-12-06 | Disposition: A | Discharge status: Skilled Nursing Facility | Payer: Medicare Other | Attending: Emergency Medicine | Admitting: Emergency Medicine

## 2021-12-06 ENCOUNTER — Emergency Department (HOSPITAL_COMMUNITY): Payer: Medicare Other

## 2021-12-06 ENCOUNTER — Encounter (HOSPITAL_COMMUNITY): Payer: Self-pay

## 2021-12-06 DIAGNOSIS — J45909 Unspecified asthma, uncomplicated: Secondary | ICD-10-CM | POA: Insufficient documentation

## 2021-12-06 DIAGNOSIS — H04002 Unspecified dacryoadenitis, left lacrimal gland: Secondary | ICD-10-CM | POA: Insufficient documentation

## 2021-12-06 DIAGNOSIS — Z79899 Other long term (current) drug therapy: Secondary | ICD-10-CM | POA: Diagnosis not present

## 2021-12-06 DIAGNOSIS — J449 Chronic obstructive pulmonary disease, unspecified: Secondary | ICD-10-CM | POA: Diagnosis not present

## 2021-12-06 DIAGNOSIS — R410 Disorientation, unspecified: Secondary | ICD-10-CM | POA: Diagnosis not present

## 2021-12-06 DIAGNOSIS — H05012 Cellulitis of left orbit: Secondary | ICD-10-CM | POA: Insufficient documentation

## 2021-12-06 DIAGNOSIS — L03213 Periorbital cellulitis: Secondary | ICD-10-CM

## 2021-12-06 DIAGNOSIS — J323 Chronic sphenoidal sinusitis: Secondary | ICD-10-CM | POA: Diagnosis not present

## 2021-12-06 DIAGNOSIS — I10 Essential (primary) hypertension: Secondary | ICD-10-CM | POA: Insufficient documentation

## 2021-12-06 DIAGNOSIS — R220 Localized swelling, mass and lump, head: Secondary | ICD-10-CM | POA: Diagnosis present

## 2021-12-06 LAB — I-STAT CHEM 8, ED
BUN: 13 mg/dL (ref 8–23)
Calcium, Ion: 1.07 mmol/L — ABNORMAL LOW (ref 1.15–1.40)
Chloride: 102 mmol/L (ref 98–111)
Creatinine, Ser: 0.6 mg/dL (ref 0.44–1.00)
Glucose, Bld: 88 mg/dL (ref 70–99)
HCT: 41 % (ref 36.0–46.0)
Hemoglobin: 13.9 g/dL (ref 12.0–15.0)
Potassium: 3.6 mmol/L (ref 3.5–5.1)
Sodium: 142 mmol/L (ref 135–145)
TCO2: 30 mmol/L (ref 22–32)

## 2021-12-06 LAB — BASIC METABOLIC PANEL
Anion gap: 6 (ref 5–15)
BUN: 11 mg/dL (ref 8–23)
CO2: 29 mmol/L (ref 22–32)
Calcium: 9 mg/dL (ref 8.9–10.3)
Chloride: 106 mmol/L (ref 98–111)
Creatinine, Ser: 0.74 mg/dL (ref 0.44–1.00)
GFR, Estimated: >60 mL/min (ref >60)
Glucose, Bld: 90 mg/dL (ref 70–99)
Potassium: 3.7 mmol/L (ref 3.5–5.1)
Sodium: 141 mmol/L (ref 135–145)

## 2021-12-06 LAB — CBC WITH DIFFERENTIAL/PLATELET
Abs Immature Granulocytes: 0.03 10*3/uL (ref 0.00–0.07)
Basophils Absolute: 0.1 10*3/uL (ref 0.0–0.1)
Basophils Relative: 1 %
Eosinophils Absolute: 0.4 10*3/uL (ref 0.0–0.5)
Eosinophils Relative: 4 %
HCT: 41.1 % (ref 36.0–46.0)
Hemoglobin: 13.3 g/dL (ref 12.0–15.0)
Immature Granulocytes: 0 %
Lymphocytes Relative: 23 %
Lymphs Abs: 2.2 10*3/uL (ref 0.7–4.0)
MCH: 31 pg (ref 26.0–34.0)
MCHC: 32.4 g/dL (ref 30.0–36.0)
MCV: 95.8 fL (ref 80.0–100.0)
Monocytes Absolute: 1.4 10*3/uL — ABNORMAL HIGH (ref 0.1–1.0)
Monocytes Relative: 15 %
Neutro Abs: 5.4 10*3/uL (ref 1.7–7.7)
Neutrophils Relative %: 57 %
Platelets: 389 10*3/uL (ref 150–400)
RBC: 4.29 MIL/uL (ref 3.87–5.11)
RDW: 14.4 % (ref 11.5–15.5)
WBC: 9.5 10*3/uL (ref 4.0–10.5)
nRBC: 0 % (ref 0.0–0.2)

## 2021-12-06 MED ORDER — SULFAMETHOXAZOLE-TRIMETHOPRIM 800-160 MG PO TABS
1.0000 | ORAL_TABLET | Freq: Once | ORAL | Status: AC
  Administered 2021-12-06: 1 via ORAL
  Filled 2021-12-06: qty 1

## 2021-12-06 MED ORDER — ACETAMINOPHEN 500 MG PO TABS: 500.0000 mg | ORAL_TABLET | Freq: Four times a day (QID) | ORAL | 0 refills | Status: DC | PRN

## 2021-12-06 MED ORDER — CEFAZOLIN SODIUM-DEXTROSE 1-4 GM/50ML-% IV SOLN
1.0000 g | Freq: Once | INTRAVENOUS | Status: AC
  Administered 2021-12-06: 1 g via INTRAVENOUS
  Filled 2021-12-06: qty 50

## 2021-12-06 MED ORDER — IOHEXOL 300 MG/ML  SOLN
100.0000 mL | Freq: Once | INTRAMUSCULAR | Status: AC | PRN
  Administered 2021-12-06: 100 mL via INTRAVENOUS

## 2021-12-06 MED ORDER — AMOXICILLIN-POT CLAVULANATE 875-125 MG PO TABS: 1.0000 | ORAL_TABLET | Freq: Two times a day (BID) | ORAL | 0 refills | Status: DC

## 2021-12-06 MED ORDER — SULFAMETHOXAZOLE-TRIMETHOPRIM 800-160 MG PO TABS: 1.0000 | ORAL_TABLET | Freq: Two times a day (BID) | ORAL | 0 refills | Status: AC

## 2021-12-06 NOTE — ED Notes (Signed)
[facility] called to transport patient  

## 2021-12-06 NOTE — ED Notes (Signed)
Pt cleaned up and placed in the bed with a bed alarm.

## 2021-12-06 NOTE — ED Triage Notes (Signed)
BIB [facility] from [facility]. C/o lt eye swelling unknown cause. Pt or facility knows what happened. Lt eye is red and swollen, partially opened. Per EMS the eye is opened more now then it was initially was. Per facility pt is intermittently confused. Per EMS pt was answering questions appropriately one min and the next min she was confused. Pt is hard of hearing, can answer her name and birthday, unable to answer any other question. Pt states that she doesn't recall any injury to her face.

## 2021-12-06 NOTE — ED Provider Notes (Signed)
[facility] EMERGENCY DEPARTMENT Provider Note  CSN: [id number] Arrival date & time: 12/06/21 0410  Chief Complaint(s) Eye Problem  ED Triage Notes Glynis Smiles, RN (Registered Nurse)   Emergency Medicine   Date of Service: 12/06/2021  4:16 AM   Signed   BIB [facility] from [facility]. C/o lt eye swelling unknown cause. Pt or facility knows what happened. Lt eye is red and swollen, partially opened. Per EMS the eye is opened more now then it was initially was. Per facility pt is intermittently confused. Per EMS pt was answering questions appropriately one min and the next min she was confused. Pt is hard of hearing, can answer her name and birthday, unable to answer any other question. Pt states that she doesn't recall any injury to her face.      HPI Lynn Knox is a 88 y.o. female with his of cognitive deficits here for left eyelid redness and swelling.   The history is provided by the patient.    Past Medical History Past Medical History:  Diagnosis Date   Asthma    COPD (chronic obstructive pulmonary disease) (HCC)    Hyperlipemia    Hypertension    Thyroid disease    Patient Active Problem List   Diagnosis Date Noted   Displaced fracture of greater trochanter of left femur, initial encounter for closed fracture (HCC) 11/10/2020   Hypertension 11/10/2020   Atrial fibrillation (HCC) 06/23/2020   Noise-induced hearing loss of both ears 06/23/2020   Prediabetes 06/23/2020   Urinary incontinence 06/23/2020   Asthma 05/28/2019   Home Medication(s) Prior to Admission medications   Medication Sig Start Date End Date Taking? Authorizing Provider  albuterol (VENTOLIN HFA) 108 (90 Base) MCG/ACT inhaler Inhale 2 puffs into the lungs every 6 (six) hours as needed for wheezing or shortness of breath. 03/09/19   [provider]  apixaban (ELIQUIS) 2.5 MG TABS tablet Take 1 tablet (2.5 mg total) by mouth 2 (two) times daily. 11/13/20 12/13/20  Demaio, Tyrone Apple, MD   hydrochlorothiazide (HYDRODIURIL) 25 MG tablet Take 25 mg by mouth daily. 01/14/19   [provider]  HYDROcodone-acetaminophen (NORCO) 7.5-325 MG tablet Take 1-2 tablets by mouth every 4 (four) hours as needed for severe pain (pain score 7-10). 11/11/20   Darrick Grinder, PA-C  ipratropium-albuterol (DUONEB) 0.5-2.5 (3) MG/3ML SOLN Take 3 mLs by nebulization 4 (four) times daily as needed. 05/28/19   Lorin Glass, MD  levothyroxine (SYNTHROID) 25 MCG tablet Take 25 mcg by mouth daily. 01/14/19   [provider]  montelukast (SINGULAIR) 10 MG tablet TAKE 1 TABLET(10 MG) BY MOUTH DAILY 05/05/20   Lorin Glass, MD  Multiple Vitamin (MULTIVITAMIN ADULT) TABS Take 1 tablet by mouth daily.    [provider]  ondansetron (ZOFRAN) 4 MG tablet Take 1 tablet (4 mg total) by mouth every 6 (six) hours as needed for nausea. 11/13/20   Demaio, Alexa, MD  pravastatin (PRAVACHOL) 20 MG tablet Take 20 mg by mouth daily. 01/14/19   [provider]  senna (SENOKOT) 8.6 MG TABS tablet Take 1 tablet (8.6 mg total) by mouth 2 (two) times daily. 11/13/20   Ilene Qua, MD  Allergies Doxycycline  Review of Systems Review of Systems As noted in HPI  Physical Exam Vital Signs  I have reviewed the triage vital signs BP 126/69   Pulse 96   Temp (!) 97.4 F (36.3 C) (Oral)   Resp (!) 24   SpO2 97%   Physical Exam Vitals reviewed.  Constitutional:      General: She is not in acute distress.    Appearance: She is well-developed. She is not diaphoretic.  HENT:     Head: Normocephalic and atraumatic.     Right Ear: External ear normal.     Left Ear: External ear normal.     Nose: Nose normal.  Eyes:     General: No scleral icterus.    Conjunctiva/sclera: Conjunctivae normal.     Comments: Swelling and erythema that is TTP of the left periorbital  region including the upper and lower lids  Neck:     Trachea: Phonation normal.  Cardiovascular:     Rate and Rhythm: Normal rate and regular rhythm.  Pulmonary:     Effort: Pulmonary effort is normal. No respiratory distress.     Breath sounds: No stridor.  Abdominal:     General: There is no distension.  Musculoskeletal:        General: Normal range of motion.     Cervical back: Normal range of motion.  Neurological:     Mental Status: She is alert and oriented to person, place, and time.  Psychiatric:        Behavior: Behavior normal.     ED Results and Treatments Labs (all labs ordered are listed, but only abnormal results are displayed) Labs Reviewed  I-STAT CHEM 8, ED - Abnormal; Notable for the following components:      Result Value   Calcium, Ion 1.07 (*)    All other components within normal limits  CBC WITH DIFFERENTIAL/PLATELET  BASIC METABOLIC PANEL                                                                                                                         EKG  EKG Interpretation  Date/Time:    Ventricular Rate:    PR Interval:    QRS Duration:   QT Interval:    QTC Calculation:   R Axis:     Text Interpretation:         Radiology No results found.  Pertinent labs & imaging results that were available during my care of the patient were reviewed by me and considered in my medical decision making (see MDM for details).  Medications Ordered in ED Medications  ceFAZolin (ANCEF) IVPB 1 g/50 mL premix (has no administration in time range)  Procedures Procedures  (including critical care time)  Medical Decision Making / ED Course    Complexity of Problem:  Co-morbidities/SDOH that complicate the patient evaluation/care: Noted above in HPI  Additional history obtained: EMS  Patient's presenting  problem/concern, DDX, and MDM listed below: Left eye swelling and redness Concerning for preseptal cellulitis Given patient's cognitive deficits, additional work-up obtained including labs to assess for evidence of more severe infection. We will also obtain a CT scan to ensure there is no extension or evidence of orbital cellulitis  Hospitalization Considered:  Yes  Initial Intervention:  IV antibiotics prophylactically   Patient care turned over to oncoming provider. Patient case and results discussed in detail; please see their note for further ED managment.        Final Clinical Impression(s) / ED Diagnoses Final diagnoses:  None           This chart was dictated using voice recognition software.  Despite best efforts to proofread,  errors can occur which can change the documentation meaning.    Nira Conn, MD 12/06/21 [phone]

## 2021-12-06 NOTE — ED Notes (Signed)
Assisted pt. To bedside commode.

## 2021-12-06 NOTE — ED Provider Notes (Addendum)
F/u labs and CT for periorbital swelling/redness. Physical Exam  Temp (!) 97.4 F (36.3 C) (Oral)   SpO2 94%   Physical Exam   Procedures  Procedures  ED Course / MDM    Medical Decision Making Amount and/or Complexity of Data Reviewed Labs: ordered. Radiology: ordered.  Risk OTC drugs. Prescription drug management.  I have reviewed the CT results. Consult: Reviewed with Dr. Dione Booze ophthalmology.  At this time plan will be to proceed with antibiotics and he will see the patient for recheck tomorrow in the office.  CT indicates dacryoadenitis with surrounding swelling and cellulitis.  No posterior orbital findings.  Patient has intact ocular motions, no fever, no leukocytosis.  Clinically she is stable.  CT also shows sinusitis but unlikely related to the dacryoadenitis.  Patient replaced on combination of Bactrim and Augmentin.  10: 05 message left on grandson cell phone for update to number and contact list.  Reviewed findings and plan with grandson at bedside.        Arby Barrette, MD 12/06/21 1007    Arby Barrette, MD 12/06/21 1007    Arby Barrette, MD 12/06/21 1026

## 2021-12-06 NOTE — ED Notes (Signed)
Verbal report given to Savannah RN at this time 

## 2021-12-06 NOTE — Discharge Instructions (Signed)
1.  You have an infection of the eyelid.  You need to take antibiotics as prescribed.  Take Bactrim twice a day and Augmentin twice a day as prescribed.  First doses for the morning were given in the emergency department.  Start your medications this evening. 2.  Take extra strength Tylenol every 6 hours as needed for pain.  Use very warm clean compresses on the eye to clear drainage and to help with pain.  You may take a very warm clean washcloth and laid over the eye for about 10 to 15 minutes every several hours. 3.  Call Dr.Groat's office tomorrow morning.  He expects to see you tomorrow in the office to do a reexamination.

## 2021-12-06 NOTE — ED Notes (Signed)
Entered the room found pt had been in the bed however pt diaper was on the floor, urine on the floor, and pt was removed from the monitor. Pt placed on the monitor at this time

## 2022-01-06 IMAGING — RF DG HIP (WITH PELVIS) OPERATIVE*L*
1 series · 2 of 2 positions shown · non-contrast
Comparison: None.

CLINICAL DATA: Open reduction and internal fixation of left hip
fracture.

EXAM:
OPERATIVE left HIP (WITH PELVIS IF PERFORMED) 2 VIEWS
TECHNIQUE: Fluoroscopic spot image(s) were submitted for interpretation
post-operatively.
Radiation exposure index: 8.0764 mGy.

[Series 1: unknown protocol · 0.20mm/px · 2 of 2 slices shown]
[im 1/2]
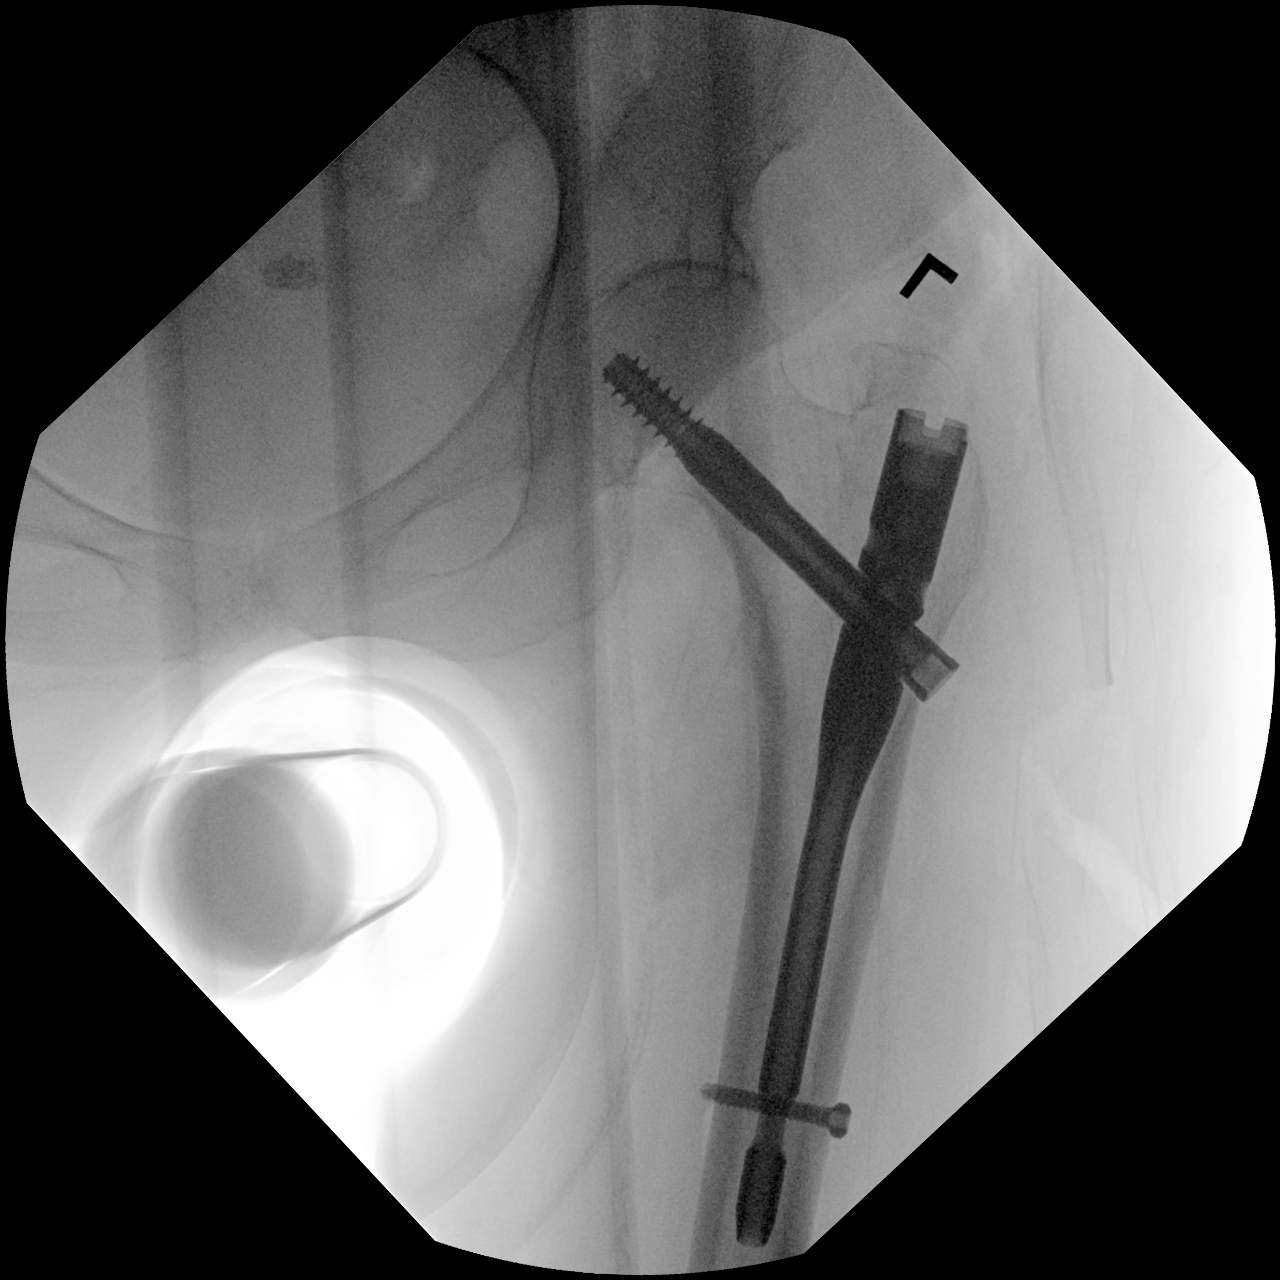
[im 2/2]
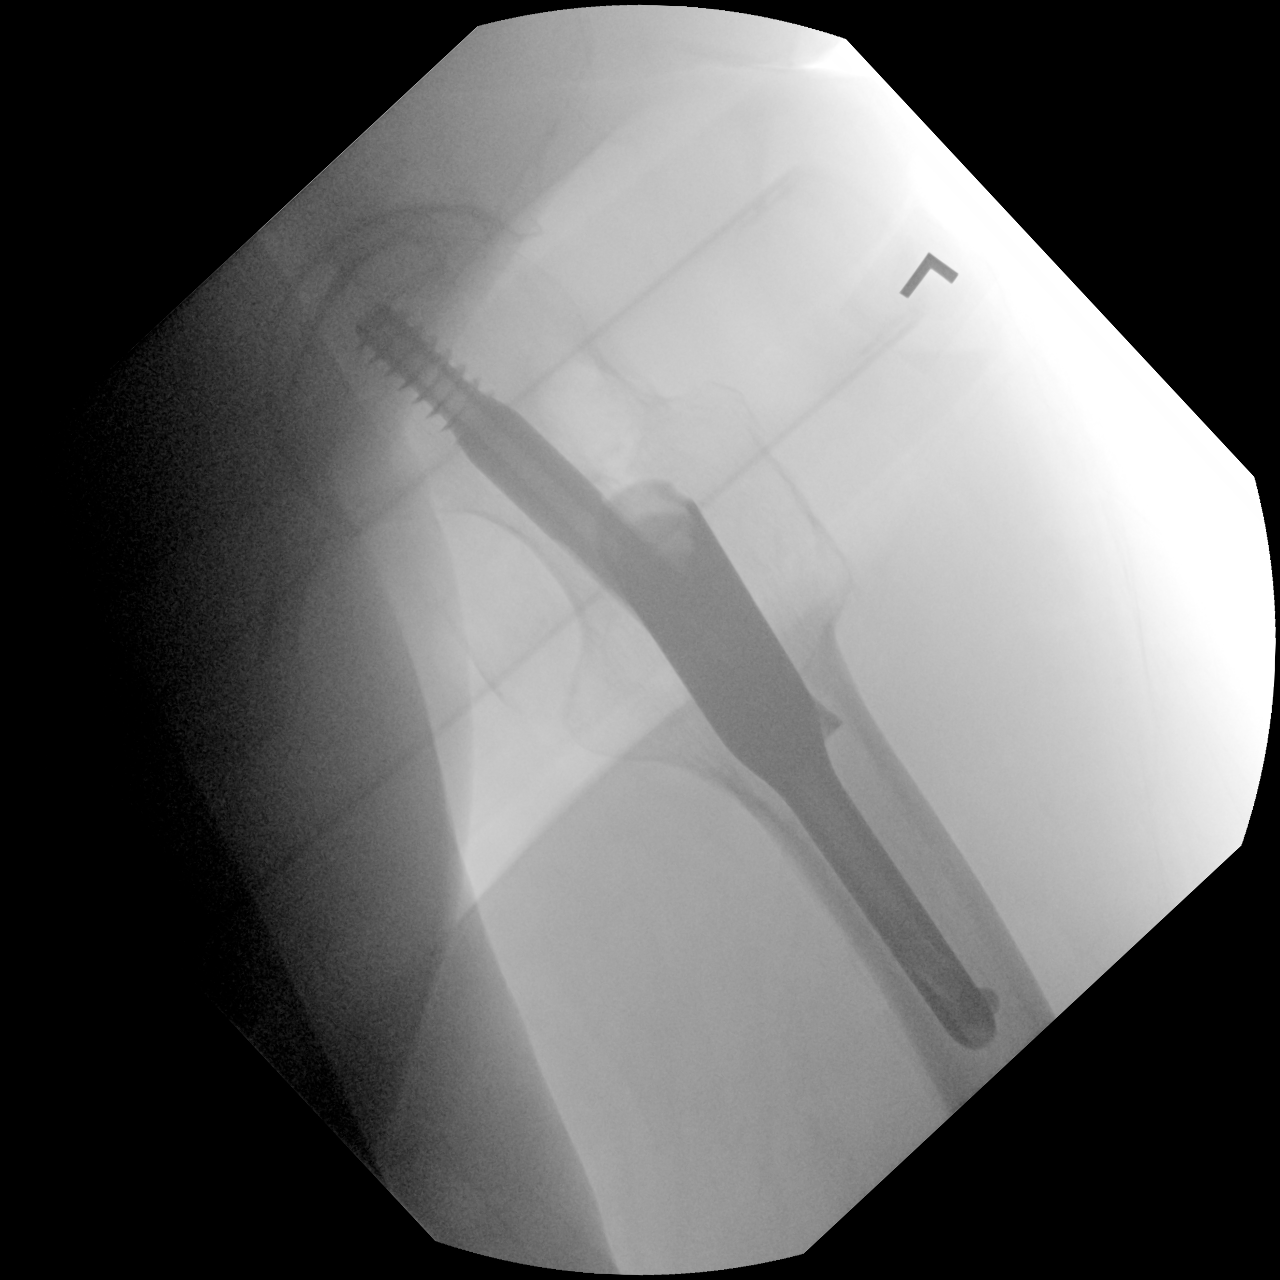

[2 of 2 positions shown; findings below may reference images not displayed]

FINDINGS: Two intraoperative fluoroscopic images of the left hip demonstrate
surgical internal fixation of proximal left femoral fracture. Good
alignment of fracture components is noted.
IMPRESSION: Fluoroscopic guidance provided during open reduction and surgical
internal fixation of proximal left femoral fracture.

## 2022-05-04 ENCOUNTER — Inpatient Hospital Stay (HOSPITAL_COMMUNITY)
Admission: EM | Admit: 2022-05-04 | Discharge: 2022-05-07 | DRG: 193 | Disposition: A | Discharge status: Skilled Nursing Facility | Payer: Medicare Other | Source: Skilled Nursing Facility | Attending: Family Medicine | Admitting: Family Medicine

## 2022-05-04 ENCOUNTER — Other Ambulatory Visit: Payer: Self-pay

## 2022-05-04 ENCOUNTER — Emergency Department (HOSPITAL_COMMUNITY): Payer: Medicare Other

## 2022-05-04 ENCOUNTER — Encounter (HOSPITAL_COMMUNITY): Payer: Self-pay

## 2022-05-04 DIAGNOSIS — R791 Abnormal coagulation profile: Secondary | ICD-10-CM | POA: Diagnosis present

## 2022-05-04 DIAGNOSIS — A419 Sepsis, unspecified organism: Secondary | ICD-10-CM

## 2022-05-04 DIAGNOSIS — F03918 Unspecified dementia, unspecified severity, with other behavioral disturbance: Secondary | ICD-10-CM

## 2022-05-04 DIAGNOSIS — I4891 Unspecified atrial fibrillation: Secondary | ICD-10-CM | POA: Diagnosis present

## 2022-05-04 DIAGNOSIS — E876 Hypokalemia: Secondary | ICD-10-CM | POA: Diagnosis present

## 2022-05-04 DIAGNOSIS — H9190 Unspecified hearing loss, unspecified ear: Secondary | ICD-10-CM | POA: Diagnosis present

## 2022-05-04 DIAGNOSIS — E785 Hyperlipidemia, unspecified: Secondary | ICD-10-CM | POA: Diagnosis present

## 2022-05-04 DIAGNOSIS — J440 Chronic obstructive pulmonary disease with acute lower respiratory infection: Secondary | ICD-10-CM | POA: Diagnosis present

## 2022-05-04 DIAGNOSIS — H547 Unspecified visual loss: Secondary | ICD-10-CM | POA: Diagnosis present

## 2022-05-04 DIAGNOSIS — Z79899 Other long term (current) drug therapy: Secondary | ICD-10-CM

## 2022-05-04 DIAGNOSIS — Z1152 Encounter for screening for COVID-19: Secondary | ICD-10-CM

## 2022-05-04 DIAGNOSIS — J210 Acute bronchiolitis due to respiratory syncytial virus: Secondary | ICD-10-CM | POA: Diagnosis present

## 2022-05-04 DIAGNOSIS — E039 Hypothyroidism, unspecified: Secondary | ICD-10-CM | POA: Diagnosis present

## 2022-05-04 DIAGNOSIS — J9601 Acute respiratory failure with hypoxia: Secondary | ICD-10-CM

## 2022-05-04 DIAGNOSIS — Z79890 Hormone replacement therapy (postmenopausal): Secondary | ICD-10-CM

## 2022-05-04 DIAGNOSIS — R8281 Pyuria: Secondary | ICD-10-CM | POA: Diagnosis present

## 2022-05-04 DIAGNOSIS — J121 Respiratory syncytial virus pneumonia: Secondary | ICD-10-CM | POA: Diagnosis present

## 2022-05-04 DIAGNOSIS — J189 Pneumonia, unspecified organism: Principal | ICD-10-CM

## 2022-05-04 DIAGNOSIS — F03911 Unspecified dementia, unspecified severity, with agitation: Secondary | ICD-10-CM | POA: Diagnosis present

## 2022-05-04 DIAGNOSIS — Z881 Allergy status to other antibiotic agents status: Secondary | ICD-10-CM

## 2022-05-04 DIAGNOSIS — Z66 Do not resuscitate: Secondary | ICD-10-CM | POA: Diagnosis present

## 2022-05-04 DIAGNOSIS — E872 Acidosis, unspecified: Secondary | ICD-10-CM

## 2022-05-04 DIAGNOSIS — R3129 Other microscopic hematuria: Secondary | ICD-10-CM | POA: Diagnosis present

## 2022-05-04 DIAGNOSIS — I10 Essential (primary) hypertension: Secondary | ICD-10-CM | POA: Diagnosis present

## 2022-05-04 DIAGNOSIS — Z7401 Bed confinement status: Secondary | ICD-10-CM | POA: Diagnosis not present

## 2022-05-04 DIAGNOSIS — R609 Edema, unspecified: Secondary | ICD-10-CM | POA: Diagnosis not present

## 2022-05-04 LAB — COMPREHENSIVE METABOLIC PANEL
ALT: 23 U/L (ref 0–44)
AST: 39 U/L (ref 15–41)
Albumin: 3.5 g/dL (ref 3.5–5.0)
Alkaline Phosphatase: 87 U/L (ref 38–126)
Anion gap: 11 (ref 5–15)
BUN: 14 mg/dL (ref 8–23)
CO2: 30 mmol/L (ref 22–32)
Calcium: 9.1 mg/dL (ref 8.9–10.3)
Chloride: 103 mmol/L (ref 98–111)
Creatinine, Ser: 0.87 mg/dL (ref 0.44–1.00)
GFR, Estimated: >60 mL/min (ref >60)
Glucose, Bld: 125 mg/dL — ABNORMAL HIGH (ref 70–99)
Potassium: 3.2 mmol/L — ABNORMAL LOW (ref 3.5–5.1)
Sodium: 144 mmol/L (ref 135–145)
Total Bilirubin: 0.7 mg/dL (ref 0.3–1.2)
Total Protein: 7.2 g/dL (ref 6.5–8.1)

## 2022-05-04 LAB — URINALYSIS, ROUTINE W REFLEX MICROSCOPIC
Bacteria, UA: NONE SEEN
Bilirubin Urine: NEGATIVE
Glucose, UA: NEGATIVE mg/dL
Ketones, ur: NEGATIVE mg/dL
Nitrite: NEGATIVE
Protein, ur: NEGATIVE mg/dL
Specific Gravity, Urine: 1.015 (ref 1.005–1.030)
pH: 7 (ref 5.0–8.0)

## 2022-05-04 LAB — BRAIN NATRIURETIC PEPTIDE: B Natriuretic Peptide: 134.7 pg/mL — ABNORMAL HIGH (ref 0.0–100.0)

## 2022-05-04 LAB — RESP PANEL BY RT-PCR (RSV, FLU A&B, COVID)  RVPGX2
Influenza A by PCR: NEGATIVE
Influenza B by PCR: NEGATIVE
Resp Syncytial Virus by PCR: POSITIVE — AB
SARS Coronavirus 2 by RT PCR: NEGATIVE

## 2022-05-04 LAB — LACTIC ACID, PLASMA: Lactic Acid, Venous: 2.5 mmol/L (ref 0.5–1.9)

## 2022-05-04 LAB — CBC WITH DIFFERENTIAL/PLATELET
Abs Immature Granulocytes: 0.02 10*3/uL (ref 0.00–0.07)
Basophils Absolute: 0.1 10*3/uL (ref 0.0–0.1)
Basophils Relative: 2 %
Eosinophils Absolute: 0.3 10*3/uL (ref 0.0–0.5)
Eosinophils Relative: 5 %
HCT: 44.4 % (ref 36.0–46.0)
Hemoglobin: 14.8 g/dL (ref 12.0–15.0)
Immature Granulocytes: 0 %
Lymphocytes Relative: 25 %
Lymphs Abs: 1.5 10*3/uL (ref 0.7–4.0)
MCH: 33 pg (ref 26.0–34.0)
MCHC: 33.3 g/dL (ref 30.0–36.0)
MCV: 99.1 fL (ref 80.0–100.0)
Monocytes Absolute: 1.4 10*3/uL — ABNORMAL HIGH (ref 0.1–1.0)
Monocytes Relative: 23 %
Neutro Abs: 2.7 10*3/uL (ref 1.7–7.7)
Neutrophils Relative %: 45 %
Platelets: 188 10*3/uL (ref 150–400)
RBC: 4.48 MIL/uL (ref 3.87–5.11)
RDW: 14.3 % (ref 11.5–15.5)
WBC: 6 10*3/uL (ref 4.0–10.5)
nRBC: 0 % (ref 0.0–0.2)

## 2022-05-04 LAB — PROTIME-INR
INR: 3.3 — ABNORMAL HIGH (ref 0.8–1.2)
Prothrombin Time: 33.5 seconds — ABNORMAL HIGH (ref 11.4–15.2)

## 2022-05-04 LAB — TROPONIN I (HIGH SENSITIVITY)
Troponin I (High Sensitivity): 14 ng/L (ref <18)
Troponin I (High Sensitivity): 16 ng/L (ref <18)

## 2022-05-04 LAB — TSH: TSH: 2.487 u[IU]/mL (ref 0.350–4.500)

## 2022-05-04 LAB — APTT: aPTT: 42 seconds — ABNORMAL HIGH (ref 24–36)

## 2022-05-04 MED ORDER — SODIUM CHLORIDE 0.9 % IV SOLN
500.0000 mg | Freq: Once | INTRAVENOUS | Status: AC
  Administered 2022-05-04: 500 mg via INTRAVENOUS
  Filled 2022-05-04: qty 5

## 2022-05-04 MED ORDER — LACTATED RINGERS IV BOLUS
1000.0000 mL | Freq: Once | INTRAVENOUS | Status: AC
  Administered 2022-05-04: 1000 mL via INTRAVENOUS

## 2022-05-04 MED ORDER — SODIUM CHLORIDE 0.9 % IV SOLN
1.0000 g | Freq: Once | INTRAVENOUS | Status: AC
  Administered 2022-05-04: 1 g via INTRAVENOUS
  Filled 2022-05-04: qty 10

## 2022-05-04 MED ORDER — HEPARIN (PORCINE) 25000 UT/250ML-% IV SOLN
900.0000 [IU]/h | INTRAVENOUS | Status: DC
  Filled 2022-05-04: qty 250

## 2022-05-04 MED ORDER — HALOPERIDOL LACTATE 5 MG/ML IJ SOLN
INTRAMUSCULAR | Status: AC
  Administered 2022-05-05: 2 mg
  Filled 2022-05-04: qty 1

## 2022-05-04 MED ORDER — HALOPERIDOL LACTATE 5 MG/ML IJ SOLN: 2.0000 mg | Freq: Once | INTRAMUSCULAR | Status: DC

## 2022-05-04 MED ORDER — LORAZEPAM 2 MG/ML IJ SOLN: 0.5000 mg | Freq: Once | INTRAMUSCULAR | Status: DC

## 2022-05-04 MED ORDER — METOPROLOL TARTRATE 5 MG/5ML IV SOLN
5.0000 mg | Freq: Once | INTRAVENOUS | Status: AC
  Administered 2022-05-04: 5 mg via INTRAVENOUS
  Filled 2022-05-04: qty 5

## 2022-05-04 MED ORDER — LEVALBUTEROL HCL 0.63 MG/3ML IN NEBU
0.6300 mg | INHALATION_SOLUTION | RESPIRATORY_TRACT | Status: DC | PRN
  Administered 2022-05-05 (×3): 0.63 mg via RESPIRATORY_TRACT
  Filled 2022-05-04 (×3): qty 3

## 2022-05-04 MED ORDER — MAGNESIUM SULFATE IN D5W 1-5 GM/100ML-% IV SOLN
1.0000 g | Freq: Once | INTRAVENOUS | Status: AC
  Administered 2022-05-05: 1 g via INTRAVENOUS
  Filled 2022-05-04: qty 100

## 2022-05-04 MED ORDER — POTASSIUM CHLORIDE 10 MEQ/100ML IV SOLN
10.0000 meq | INTRAVENOUS | Status: AC
  Administered 2022-05-04 – 2022-05-05 (×2): 10 meq via INTRAVENOUS
  Filled 2022-05-04 (×2): qty 100

## 2022-05-04 MED ORDER — HEPARIN BOLUS VIA INFUSION
2000.0000 [IU] | Freq: Once | INTRAVENOUS | Status: DC
  Filled 2022-05-04: qty 2000

## 2022-05-04 MED ORDER — LACTATED RINGERS IV SOLN: INTRAVENOUS | Status: DC

## 2022-05-04 MED ORDER — IPRATROPIUM BROMIDE 0.02 % IN SOLN
0.5000 mg | RESPIRATORY_TRACT | Status: DC | PRN
  Administered 2022-05-05: 0.5 mg via RESPIRATORY_TRACT
  Filled 2022-05-04: qty 2.5

## 2022-05-04 NOTE — ED Notes (Signed)
Dr gray informed of patient's lactic acid of 2.5. no new verbal orders received.

## 2022-05-04 NOTE — ED Triage Notes (Signed)
From facililty. Complain of flu-like symtoms. Cough, SOB, not feeling well.   EMS VS  148/46 BP 100 HR

## 2022-05-04 NOTE — H&P (Incomplete)
History and Physical    Patient: Lynn Knox TOI:[id number] DOB: 07-06-1924 DOA: 05/04/2022 DOS: the patient was seen and examined on 05/04/2022 PCP: Medicine, [facility]  Patient coming from: SNF  Chief Complaint:  Chief Complaint  Patient presents with  . Cough       . Shortness of Breath   HPI: Lynn Knox is a 88 y.o. female with medical history significant of dementia, atrial fibrillation, COPD, HTN, hypothyroidism who presents with increase shortness of breath and cough.   Pt with dementia and was agitated with staff not able to provide hx. Not able to reach grandson by phone on record. She appears dry on exam with labored respirations.   In the ED, she was afebrile, HR of 146, BP 136/87. No leukocytosis or anemia. Lactic of 2.5.  RSV+   Hypokalemia of 3.2, creatinine of 0.87 with CBG of 125.   CXR with patchy mild left lung pneumonia. She was given several dose of IV metoprolol with improvement in her heart rate and antibiotics for her pneumonia.   Hospitalist then consulted for admission.  Review of Systems: {ROS_Text:26778} Past Medical History:  Diagnosis Date  . Asthma   . COPD (chronic obstructive pulmonary disease) (HCC)   . Hyperlipemia   . Hypertension   . Thyroid disease    Past Surgical History:  Procedure Laterality Date  . INTRAMEDULLARY (IM) NAIL INTERTROCHANTERIC Left 11/10/2020   Procedure: INTRAMEDULLARY (IM) NAIL INTERTROCHANTRIC;  Surgeon: Samson Frederic, MD;  Location: [facility] OR;  Service: Orthopedics;  Laterality: Left;  . TONSILLECTOMY     Social History:  reports that she has never smoked. She has never used smokeless tobacco. She reports that she does not drink alcohol and does not use drugs.  Allergies  Allergen Reactions  . Doxycycline     Pt states makes her eye red    History reviewed. No pertinent family history.  Prior to Admission medications   Medication Sig Start Date End Date Taking? Authorizing Provider   acetaminophen (TYLENOL) 500 MG tablet Take 1 tablet (500 mg total) by mouth every 6 (six) hours as needed. 12/06/21   Arby Barrette, MD  albuterol (VENTOLIN HFA) 108 (90 Base) MCG/ACT inhaler Inhale 2 puffs into the lungs every 6 (six) hours as needed for wheezing or shortness of breath. 03/09/19   [provider]  amoxicillin-clavulanate (AUGMENTIN) 875-125 MG tablet Take 1 tablet by mouth every 12 (twelve) hours. 12/06/21   Arby Barrette, MD  apixaban (ELIQUIS) 2.5 MG TABS tablet Take 1 tablet (2.5 mg total) by mouth 2 (two) times daily. 11/13/20 12/13/20  Demaio, Tyrone Apple, MD  hydrochlorothiazide (HYDRODIURIL) 25 MG tablet Take 25 mg by mouth daily. 01/14/19   [provider]  HYDROcodone-acetaminophen (NORCO) 7.5-325 MG tablet Take 1-2 tablets by mouth every 4 (four) hours as needed for severe pain (pain score 7-10). 11/11/20   Darrick Grinder, PA-C  ipratropium-albuterol (DUONEB) 0.5-2.5 (3) MG/3ML SOLN Take 3 mLs by nebulization 4 (four) times daily as needed. 05/28/19   Lorin Glass, MD  levothyroxine (SYNTHROID) 25 MCG tablet Take 25 mcg by mouth daily. 01/14/19   [provider]  montelukast (SINGULAIR) 10 MG tablet TAKE 1 TABLET(10 MG) BY MOUTH DAILY 05/05/20   Lorin Glass, MD  Multiple Vitamin (MULTIVITAMIN ADULT) TABS Take 1 tablet by mouth daily.    [provider]  ondansetron (ZOFRAN) 4 MG tablet Take 1 tablet (4 mg total) by mouth every 6 (six) hours as needed for nausea. 11/13/20  Demaio, Alexa, MD  pravastatin (PRAVACHOL) 20 MG tablet Take 20 mg by mouth daily. 01/14/19   [provider]  senna (SENOKOT) 8.6 MG TABS tablet Take 1 tablet (8.6 mg total) by mouth 2 (two) times daily. 11/13/20   Ilene Qua, MD    Physical Exam: Vitals:   05/04/22 2200 05/04/22 2230 05/04/22 2240 05/04/22 2300  BP: 123/61 126/77 (!) 148/77   Pulse: (!) 122 (!) 140 (!) 107   Resp: 19 (!) 24 (!) 29   Temp:      TempSrc:      SpO2: 96% 96% 92%   Weight:     63.5 kg  Height:    5\' 1"  (1.549 m)   *** Data Reviewed: {Tip this will not be part of the note when signed- Document your independent interpretation of telemetry tracing, EKG, lab, Radiology test or any other diagnostic tests. Add any new diagnostic test ordered today. (Optional):26781} {Results:26384}  Assessment and Plan: No notes have been filed under this hospital service. Service: Hospitalist     Advance Care Planning:   Code Status: Prior ***  Consults: ***  Family Communication: ***  Severity of Illness: {Observation/Inpatient:21159}  Author: , DO 05/04/2022 11:29 PM  For on call review [email or web address].

## 2022-05-04 NOTE — ED Provider Notes (Signed)
[facility] EMERGENCY DEPARTMENT Provider Note  CSN: [id number] Arrival date & time: 05/04/22 1825  Chief Complaint(s) Cough (/) and Shortness of Breath  HPI Lynn Knox is a 88 y.o. female   with past medical history as below, significant for afib, dementia, HLD, HTN, thyroid disease, COPD, nursing home resident who presents to the ED with complaint of flu like s/s, elevated HR. Level 5 caveat, dementia. Pt denies and current symptoms, reports she is ready to go home and has "things to do." She denies cp, dib, n/v, no fevers/chills, medication changes. Bed bound per ems. No palpitations, cp or dib reported. She is coughing repeatedly during interview. Per EMS low grade fever.    Past Medical History Past Medical History:  Diagnosis Date   Asthma    COPD (chronic obstructive pulmonary disease) ([place])    Hyperlipemia    Hypertension    Thyroid disease    Patient Active Problem List   Diagnosis Date Noted   Atrial fibrillation with RVR ([place]) 05/04/2022   Hypothyroidism 05/04/2022   RSV (respiratory syncytial virus pneumonia) 05/04/2022   Displaced fracture of greater trochanter of left femur, initial encounter for closed fracture ([place]) 11/10/2020   Hypertension 11/10/2020   Atrial fibrillation ([place]) 06/23/2020   Noise-induced hearing loss of both ears 06/23/2020   Prediabetes 06/23/2020   Urinary incontinence 06/23/2020   Asthma 05/28/2019   Home Medication(s) Prior to Admission medications   Medication Sig Start Date End Date Taking? Authorizing Provider  acetaminophen (TYLENOL) 500 MG tablet Take 1 tablet (500 mg total) by mouth every 6 (six) hours as needed. 12/06/21   Charlesetta Shanks, MD  albuterol (VENTOLIN HFA) 108 (90 Base) MCG/ACT inhaler Inhale 2 puffs into the lungs every 6 (six) hours as needed for wheezing or shortness of breath. 03/09/19   [provider]  amoxicillin-clavulanate (AUGMENTIN) 875-125 MG tablet Take 1 tablet by mouth every 12  (twelve) hours. 12/06/21   Charlesetta Shanks, MD  apixaban (ELIQUIS) 2.5 MG TABS tablet Take 1 tablet (2.5 mg total) by mouth 2 (two) times daily. 11/13/20 12/13/20  Demaio, Roxine Caddy, MD  hydrochlorothiazide (HYDRODIURIL) 25 MG tablet Take 25 mg by mouth daily. 01/14/19   [provider]  HYDROcodone-acetaminophen (NORCO) 7.5-325 MG tablet Take 1-2 tablets by mouth every 4 (four) hours as needed for severe pain (pain score 7-10). 11/11/20   Dorothyann Peng, PA-C  ipratropium-albuterol (DUONEB) 0.5-2.5 (3) MG/3ML SOLN Take 3 mLs by nebulization 4 (four) times daily as needed. 05/28/19   Candee Furbish, MD  levothyroxine (SYNTHROID) 25 MCG tablet Take 25 mcg by mouth daily. 01/14/19   [provider]  montelukast (SINGULAIR) 10 MG tablet TAKE 1 TABLET(10 MG) BY MOUTH DAILY 05/05/20   Candee Furbish, MD  Multiple Vitamin (MULTIVITAMIN ADULT) TABS Take 1 tablet by mouth daily.    [provider]  ondansetron (ZOFRAN) 4 MG tablet Take 1 tablet (4 mg total) by mouth every 6 (six) hours as needed for nausea. 11/13/20   Demaio, Alexa, MD  pravastatin (PRAVACHOL) 20 MG tablet Take 20 mg by mouth daily. 01/14/19   [provider]  senna (SENOKOT) 8.6 MG TABS tablet Take 1 tablet (8.6 mg total) by mouth 2 (two) times daily. 11/13/20   Scarlett Presto, MD  Past Surgical History Past Surgical History:  Procedure Laterality Date   INTRAMEDULLARY (IM) NAIL INTERTROCHANTERIC Left 11/10/2020   Procedure: INTRAMEDULLARY (IM) NAIL INTERTROCHANTRIC;  Surgeon: Samson Frederic, MD;  Location: [facility] OR;  Service: Orthopedics;  Laterality: Left;   TONSILLECTOMY     Family History History reviewed. No pertinent family history.  Social History Social History   Tobacco Use   Smoking status: Never   Smokeless tobacco: Never  Vaping Use   Vaping Use: Never used  Substance Use  Topics   Alcohol use: Never   Drug use: Never   Allergies Doxycycline  Review of Systems Review of Systems  Unable to perform ROS: Dementia    Physical Exam Vital Signs  I have reviewed the triage vital signs BP (!) 147/86   Pulse (!) 118   Temp 98.6 F (37 C) (Oral)   Resp (!) 26   Ht 5\' 1"  (1.549 m)   Wt 63.5 kg Comment: estimate  SpO2 90%   BMI 26.45 kg/m  Physical Exam Vitals and nursing note reviewed.  Constitutional:      General: She is not in acute distress.    Appearance: Normal appearance. She is not ill-appearing.  HENT:     Head: Normocephalic and atraumatic.     Right Ear: External ear normal.     Left Ear: External ear normal.     Nose: Nose normal.     Mouth/Throat:     Mouth: Mucous membranes are moist.  Eyes:     General: No scleral icterus.       Right eye: No discharge.        Left eye: No discharge.  Cardiovascular:     Rate and Rhythm: Tachycardia present. Rhythm irregular.     Pulses: Normal pulses.     Heart sounds: Normal heart sounds.  Pulmonary:     Effort: Pulmonary effort is normal. No respiratory distress.     Breath sounds: Normal breath sounds.  Abdominal:     General: Abdomen is flat.     Palpations: Abdomen is soft.     Tenderness: There is no abdominal tenderness.  Musculoskeletal:        General: Normal range of motion.     Cervical back: Normal range of motion.     Right lower leg: Edema present.     Left lower leg: Edema present.  Skin:    General: Skin is warm and dry.     Capillary Refill: Capillary refill takes less than 2 seconds.  Neurological:     Mental Status: She is alert and oriented to person, place, and time.     GCS: GCS eye subscore is 4. GCS verbal subscore is 5. GCS motor subscore is 6.  Psychiatric:        Mood and Affect: Mood normal.        Behavior: Behavior normal.     ED Results and Treatments Labs (all labs ordered are listed, but only abnormal results are displayed) Labs Reviewed   RESP PANEL BY RT-PCR (RSV, FLU A&B, COVID)  RVPGX2 - Abnormal; Notable for the following components:      Result Value   Resp Syncytial Virus by PCR POSITIVE (*)    All other components within normal limits  LACTIC ACID, PLASMA - Abnormal; Notable for the following components:   Lactic Acid, Venous 2.5 (*)    All other components within normal limits  COMPREHENSIVE METABOLIC PANEL - Abnormal; Notable for the following components:   Potassium  3.2 (*)    Glucose, Bld 125 (*)    All other components within normal limits  CBC WITH DIFFERENTIAL/PLATELET - Abnormal; Notable for the following components:   Monocytes Absolute 1.4 (*)    All other components within normal limits  PROTIME-INR - Abnormal; Notable for the following components:   Prothrombin Time 33.5 (*)    INR 3.3 (*)    All other components within normal limits  APTT - Abnormal; Notable for the following components:   aPTT 42 (*)    All other components within normal limits  URINALYSIS, ROUTINE W REFLEX MICROSCOPIC - Abnormal; Notable for the following components:   APPearance CLOUDY (*)    Hgb urine dipstick SMALL (*)    Leukocytes,Ua MODERATE (*)    All other components within normal limits  BRAIN NATRIURETIC PEPTIDE - Abnormal; Notable for the following components:   B Natriuretic Peptide 134.7 (*)    All other components within normal limits  CULTURE, BLOOD (ROUTINE X 2)  CULTURE, BLOOD (ROUTINE X 2)  URINE CULTURE  TSH  LACTIC ACID, PLASMA  HEPARIN LEVEL (UNFRACTIONATED)  CBC  PROCALCITONIN  PROTIME-INR  TROPONIN I (HIGH SENSITIVITY)  TROPONIN I (HIGH SENSITIVITY)                                                                                                                          Radiology DG Chest Port 1 View  Result Date: 05/04/2022 CLINICAL DATA:  Cough and flu like symptoms EXAM: PORTABLE CHEST 1 VIEW COMPARISON:  11/10/2020 FINDINGS: Cardiac shadow is within normal limits. Aortic calcifications and  mitral annular patchy airspace opacity is noted in the left mid lung likely representing early infiltrate. This is somewhat obscured by overlying leads. No bony abnormality is noted. IMPRESSION: Patchy left mid lung airspace opacity. Electronically Signed   By: Inez Catalina M.D.   On: 05/04/2022 19:33    Pertinent labs & imaging results that were available during my care of the patient were reviewed by me and considered in my medical decision making (see MDM for details).  Medications Ordered in ED Medications  lactated ringers infusion ( Intravenous New Bag/Given 05/04/22 2208)  potassium chloride 10 mEq in 100 mL IVPB (10 mEq Intravenous New Bag/Given 05/04/22 2343)  ipratropium (ATROVENT) nebulizer solution 0.5 mg (has no administration in time range)  levalbuterol (XOPENEX) nebulizer solution 0.63 mg (has no administration in time range)  magnesium sulfate IVPB 1 g 100 mL (has no administration in time range)  haloperidol lactate (HALDOL) injection 2 mg (2 mg Intravenous Not Given 05/05/22 0001)  lactated ringers bolus 1,000 mL (0 mLs Intravenous Stopped 05/04/22 2105)  cefTRIAXone (ROCEPHIN) 1 g in sodium chloride 0.9 % 100 mL IVPB (0 g Intravenous Stopped 05/04/22 2105)  azithromycin (ZITHROMAX) 500 mg in sodium chloride 0.9 % 250 mL IVPB (0 mg Intravenous Stopped 05/04/22 2241)  metoprolol tartrate (LOPRESSOR) injection 5 mg (5 mg Intravenous Given 05/04/22 2044)  metoprolol tartrate (LOPRESSOR) injection 5  mg (5 mg Intravenous Given 05/04/22 2231)  haloperidol lactate (HALDOL) 5 MG/ML injection (2 mg  Given 05/05/22 0001)                                                                                                                                     Procedures .Critical Care  Performed by: Jeanell Sparrow, DO Authorized by: Jeanell Sparrow, DO   Critical care provider statement:    Critical care time (minutes):  30   Critical care time was exclusive of:  Separately billable  procedures and treating other patients   Critical care was necessary to treat or prevent imminent or life-threatening deterioration of the following conditions:  Cardiac failure and sepsis   Critical care was time spent personally by me on the following activities:  Development of treatment plan with patient or surrogate, discussions with consultants, evaluation of patient's response to treatment, examination of patient, ordering and review of laboratory studies, ordering and review of radiographic studies, ordering and performing treatments and interventions, pulse oximetry, re-evaluation of patient's condition, review of old charts and obtaining history from patient or surrogate   Care discussed with: admitting provider     (including critical care time)  Medical Decision Making / ED Course   MDM:  Lynn Knox is a 88 y.o. female   with past medical history as above, significant for afib, dementia, HLD, HTN, thyroid disease, COPD, nursing home resident which further complicates the presenting complaint. Pt presents to the ED with complaint of flu like s/s. The complaint involves an extensive differential diagnosis and also carries with it a high risk of complications and morbidity.  Serious etiology was considered. Ddx includes but is not limited to: Differential diagnosis for adult fever includes but is not exclusive to community-acquired pneumonia, urinary tract infection, acute cholecystitis, viral syndrome, cellulitis, tick bourne disease,  decubitus ulcer, necrotizing fasciitis, meningitis, encephalitis, influenza, etc. Pt resting comfortably on initial assessment, HR is elevated, afib with RVR, her bp is stable. She denies any current symptoms  CXR c/w PNA, RSV +tive on RVP. She is on ambient air. She has hx COPD and has some scant wheezing. Afib RVR noted on tele, hx of AFIB, not on doac. Not candidate for cardioversion currently. CHADSVASC is 4. Will start heparin. Given lopressor IV which  did improve her HR, will rpt dose lopressor. Continue IVF. HR improved with rpt dose and IVF  Spoke with grandson at bedside, reports pt with URI s/s last 2 days, coughing, not feeling well. Is unsure about her home medications but reports does not take blood thinner.   Pt with PNA and RSV, started on broad spectrum abx, code sepsis was activated  Recommend admission, pt/family agreeable. Spoke w/ Dr Flossie Buffy who accepts pt for admission    Clinical Course as of 05/05/22 0005  Tue May 04, 2022  1854 Afib rvr noted on EKG today, prior EKG w/  afib [SG]  2011 Pt was previously prescribed doac for her afib, noted on prior admission 1 year ago. It looks like the rx expired and she has not been on doac since then.  [SG]  2144 Hr improved with lopressor, 140 down to 115-120. Will repeat. IVF in process [SG]    Clinical Course User Index [SG] Jeanell Sparrow, DO     Additional history obtained: -Additional history obtained from ems -External records from outside source obtained and reviewed including: Chart review including previous notes, labs, imaging, consultation notes including prior ED visits, primary care documents, prior labs/imaging, prior echo. Prior admission Echo 2/22, LVEF 65-70%,   Lab Tests: -I ordered, reviewed, and interpreted labs.   The pertinent results include:   Labs Reviewed  RESP PANEL BY RT-PCR (RSV, FLU A&B, COVID)  RVPGX2 - Abnormal; Notable for the following components:      Result Value   Resp Syncytial Virus by PCR POSITIVE (*)    All other components within normal limits  LACTIC ACID, PLASMA - Abnormal; Notable for the following components:   Lactic Acid, Venous 2.5 (*)    All other components within normal limits  COMPREHENSIVE METABOLIC PANEL - Abnormal; Notable for the following components:   Potassium 3.2 (*)    Glucose, Bld 125 (*)    All other components within normal limits  CBC WITH DIFFERENTIAL/PLATELET - Abnormal; Notable for the following components:    Monocytes Absolute 1.4 (*)    All other components within normal limits  PROTIME-INR - Abnormal; Notable for the following components:   Prothrombin Time 33.5 (*)    INR 3.3 (*)    All other components within normal limits  APTT - Abnormal; Notable for the following components:   aPTT 42 (*)    All other components within normal limits  URINALYSIS, ROUTINE W REFLEX MICROSCOPIC - Abnormal; Notable for the following components:   APPearance CLOUDY (*)    Hgb urine dipstick SMALL (*)    Leukocytes,Ua MODERATE (*)    All other components within normal limits  BRAIN NATRIURETIC PEPTIDE - Abnormal; Notable for the following components:   B Natriuretic Peptide 134.7 (*)    All other components within normal limits  CULTURE, BLOOD (ROUTINE X 2)  CULTURE, BLOOD (ROUTINE X 2)  URINE CULTURE  TSH  LACTIC ACID, PLASMA  HEPARIN LEVEL (UNFRACTIONATED)  CBC  PROCALCITONIN  PROTIME-INR  TROPONIN I (HIGH SENSITIVITY)  TROPONIN I (HIGH SENSITIVITY)    Notable for RVP w/ RSV.  K mildly depleted on BMP. No leukocytosis  EKG   EKG Interpretation  Date/Time:  Tuesday May 04 2022 18:50:58 EST Ventricular Rate:  159 PR Interval:    QRS Duration: 74 QT Interval:  300 QTC Calculation: 488 R Axis:   -7 Text Interpretation: Atrial fibrillation with rapid V-rate Borderline low voltage, extremity leads Probable anteroseptal infarct, old Repolarization abnormality, prob rate related Confirmed by Wynona Dove ([id number]) on 05/04/2022 6:53:49 PM         Imaging Studies ordered: I ordered imaging studies including CXR On my interpretation imaging demonstrates PNA I independently visualized and interpreted imaging. I agree with the radiologist interpretation   Medicines ordered and prescription drug management: Meds ordered this encounter  Medications   lactated ringers bolus 1,000 mL   cefTRIAXone (ROCEPHIN) 1 g in sodium chloride 0.9 % 100 mL IVPB    Order Specific Question:    Antibiotic Indication:    Answer:   CAP   azithromycin (ZITHROMAX) 500  mg in sodium chloride 0.9 % 250 mL IVPB   metoprolol tartrate (LOPRESSOR) injection 5 mg   lactated ringers infusion   metoprolol tartrate (LOPRESSOR) injection 5 mg   DISCONTD: heparin bolus via infusion 2,000 Units   DISCONTD: heparin ADULT infusion 100 units/mL (25000 units/250mL)   potassium chloride 10 mEq in 100 mL IVPB   ipratropium (ATROVENT) nebulizer solution 0.5 mg   levalbuterol (XOPENEX) nebulizer solution 0.63 mg   magnesium sulfate IVPB 1 g 100 mL   DISCONTD: LORazepam (ATIVAN) injection 0.5 mg   haloperidol lactate (HALDOL) injection 2 mg   haloperidol lactate (HALDOL) 5 MG/ML injection    Mayra Neer: cabinet override    -I have reviewed the patients home medicines and have made adjustments as needed   Consultations Obtained: I requested consultation with the na,  and discussed lab and imaging findings as well as pertinent plan - they recommend: na   Cardiac Monitoring: The patient was maintained on a cardiac monitor.  I personally viewed and interpreted the cardiac monitored which showed an underlying rhythm of: afib rvr  Social Determinants of Health:  Diagnosis or treatment significantly limited by social determinants of health: nursing home resident, dementia   Reevaluation: After the interventions noted above, I reevaluated the patient and found that they have improved  Co morbidities that complicate the patient evaluation  Past Medical History:  Diagnosis Date   Asthma    COPD (chronic obstructive pulmonary disease) ([place])    Hyperlipemia    Hypertension    Thyroid disease       Dispostion: Disposition decision including need for hospitalization was considered, and patient admitted to the hospital. Dr Flossie Buffy    Final Clinical Impression(s) / ED Diagnoses Final diagnoses:  Community acquired pneumonia, unspecified laterality  RSV (acute bronchiolitis due to respiratory  syncytial virus)  Atrial fibrillation with RVR ([place])  Sepsis, due to unspecified organism, unspecified whether acute organ dysfunction present [facility])     This chart was dictated using voice recognition software.  Despite best efforts to proofread,  errors can occur which can change the documentation meaning.    Jeanell Sparrow, DO 05/05/22 0006
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
## 2022-05-04 NOTE — Progress Notes (Signed)
ANTICOAGULATION CONSULT NOTE - Initial Consult  Pharmacy Consult for heparin Indication: atrial fibrillation  Allergies  Allergen Reactions   Doxycycline     Pt states makes her eye red    Patient Measurements: Height: 5\' 1"  (154.9 cm) Weight: 63.5 kg (140 lb) (estimate) IBW/kg (Calculated) : 47.8 Heparin Dosing Weight: 60kg  Vital Signs: Temp: 98.6 F (37 C) (12/26 1905) Temp Source: Oral (12/26 1905) BP: 148/77 (12/26 2240) Pulse Rate: 107 (12/26 2240)  Labs: Recent Labs    05/04/22 1900 05/04/22 2101  HGB 14.8  --   HCT 44.4  --   PLT 188  --   APTT 42*  --   LABPROT 33.5*  --   INR 3.3*  --   CREATININE 0.87  --   TROPONINIHS 14 16    Estimated Creatinine Clearance: 33 mL/min (by C-G formula based on SCr of 0.87 mg/dL).   Medical History: Past Medical History:  Diagnosis Date   Asthma    COPD (chronic obstructive pulmonary disease) (HCC)    Hyperlipemia    Hypertension    Thyroid disease     Assessment: [name] c/o flu-like sx, found to be positive for RSV, also in Afib w/ RVR, to begin heparin; known h/o Afib and was previously on Eliquis but per recent outpt notes an per family she is no longer on anticoagulation.  Goal of Therapy:  Heparin level 0.3-0.7 units/ml Monitor platelets by anticoagulation protocol: Yes   Plan:  Heparin 2000 units IV bolus x1 followed by infusion at 900 units/hr. Monitor heparin levels and CBC.  88yo, PharmD, BCPS  05/04/2022,11:10 PM

## 2022-05-04 NOTE — Assessment & Plan Note (Signed)
TSH within normal limits

## 2022-05-04 NOTE — H&P (Signed)
History and Physical    Patient: Lynn Knox ALP:[id number] DOB: 1925/03/17 DOA: 05/04/2022 DOS: the patient was seen and examined on 05/05/2022 PCP: Medicine, [facility]  Patient coming from: SNF  Chief Complaint:  Chief Complaint  Patient presents with   Cough        Shortness of Breath   HPI: Lynn Knox is a 88 y.o. female with medical history significant of dementia, atrial fibrillation, COPD, HTN, hypothyroidism who presents with increase shortness of breath and cough.   Pt with dementia and was agitated with staff not able to provide hx. Spoke with grandson who is her HCPOA over the phone. He last saw her on 12/21 and she appeared more agitated but now ill until he was told by nursing staff today that she was more short of breath. At baseline she is alert and oriented to self and place. Mostly wheelchair bound. In the ED, She appears ill, dry on exam with labored respirations and was agitated and fighting staff.   she was afebrile, HR of 146, BP 136/87. No leukocytosis or anemia. Lactic of 2.5.  RSV+   Hypokalemia of 3.2, creatinine of 0.87 with CBG of 125.   CXR with patchy mild left lung pneumonia. She was given several dose of IV metoprolol with improvement in her heart rate and antibiotics for her pneumonia.   Hospitalist then consulted for admission.  Review of Systems: As mentioned in the history of present illness. All other systems reviewed and are negative. Past Medical History:  Diagnosis Date   Asthma    COPD (chronic obstructive pulmonary disease) (HCC)    Hyperlipemia    Hypertension    Thyroid disease    Past Surgical History:  Procedure Laterality Date   INTRAMEDULLARY (IM) NAIL INTERTROCHANTERIC Left 11/10/2020   Procedure: INTRAMEDULLARY (IM) NAIL INTERTROCHANTRIC;  Surgeon: Samson Frederic, MD;  Location: [facility] OR;  Service: Orthopedics;  Laterality: Left;   TONSILLECTOMY     Social History:  reports that she has never smoked. She has  never used smokeless tobacco. She reports that she does not drink alcohol and does not use drugs.  Allergies  Allergen Reactions   Doxycycline     Pt states makes her eye red    History reviewed. No pertinent family history.  Prior to Admission medications   Medication Sig Start Date End Date Taking? Authorizing Provider  acetaminophen (TYLENOL) 500 MG tablet Take 1 tablet (500 mg total) by mouth every 6 (six) hours as needed. 12/06/21   Arby Barrette, MD  albuterol (VENTOLIN HFA) 108 (90 Base) MCG/ACT inhaler Inhale 2 puffs into the lungs every 6 (six) hours as needed for wheezing or shortness of breath. 03/09/19   [provider]  amoxicillin-clavulanate (AUGMENTIN) 875-125 MG tablet Take 1 tablet by mouth every 12 (twelve) hours. 12/06/21   Arby Barrette, MD  apixaban (ELIQUIS) 2.5 MG TABS tablet Take 1 tablet (2.5 mg total) by mouth 2 (two) times daily. 11/13/20 12/13/20  Demaio, Tyrone Apple, MD  hydrochlorothiazide (HYDRODIURIL) 25 MG tablet Take 25 mg by mouth daily. 01/14/19   [provider]  HYDROcodone-acetaminophen (NORCO) 7.5-325 MG tablet Take 1-2 tablets by mouth every 4 (four) hours as needed for severe pain (pain score 7-10). 11/11/20   Darrick Grinder, PA-C  ipratropium-albuterol (DUONEB) 0.5-2.5 (3) MG/3ML SOLN Take 3 mLs by nebulization 4 (four) times daily as needed. 05/28/19   Lorin Glass, MD  levothyroxine (SYNTHROID) 25 MCG tablet Take 25 mcg by mouth daily. 01/14/19  [provider]  montelukast (SINGULAIR) 10 MG tablet TAKE 1 TABLET(10 MG) BY MOUTH DAILY 05/05/20   Candee Furbish, MD  Multiple Vitamin (MULTIVITAMIN ADULT) TABS Take 1 tablet by mouth daily.    [provider]  ondansetron (ZOFRAN) 4 MG tablet Take 1 tablet (4 mg total) by mouth every 6 (six) hours as needed for nausea. 11/13/20   Demaio, Alexa, MD  pravastatin (PRAVACHOL) 20 MG tablet Take 20 mg by mouth daily. 01/14/19   [provider]  senna (SENOKOT) 8.6 MG TABS  tablet Take 1 tablet (8.6 mg total) by mouth 2 (two) times daily. 11/13/20   Scarlett Presto, MD    Physical Exam: Vitals:   05/04/22 2200 05/04/22 2230 05/04/22 2240 05/04/22 2300  BP: 123/61 126/77 (!) 148/77 (!) 147/86  Pulse: (!) 122 (!) 140 (!) 107 (!) 118  Resp: 19 (!) 24 (!) 29 (!) 26  Temp:      TempSrc:      SpO2: 96% 96% 92% 90%  Weight:    63.5 kg  Height:    5\' 1"  (1.549 m)   Constitutional: ill appearing combative elderly female with dementia laying in bed  Eyes:  lids and conjunctivae normal ENMT: Mucous membranes are dry with crusting around her lips Neck: normal, supple Respiratory: clear to auscultation bilaterally, no wheezing, no crackles. Normal respiratory effort. No accessory muscle use.  Cardiovascular:irregularly irregular rate and rhythm, no murmurs / rubs / gallops. No extremity edema.  Abdomen: soft, no tenderness, Bowel sounds positive.  Musculoskeletal: no clubbing / cyanosis. No joint deformity upper and lower extremities. Good ROM,  Skin: no rashes, lesions, ulcers. No induration Neurologic: CN 2-12 grossly intact.  Psychiatric: Dementia with agitated mood Data Reviewed:  See HPI  Assessment and Plan: * Atrial fibrillation with RVR (HCC) -HR up to 140 on presentation. Improved with IV Lopressor x2 in ED.  -TSH within normal limits -likely exacerbated by RSV -keep on continuous IV fluid  -keep on continuous telemetry with goal HR rate of less than 90 -Grandson is not aware of her being on anticoagulation but in the past she was noted to be on Xarelto and later Eliquis. Med rec sent over by SNF unclear of whether she is still on any. Her CHA2DS2-VASc score is 4 and I discussed risk and benefit with Grandson and he would like her to receive anticoagulation since she is mostly wheelchair bound and no hx of bleeds.  -She was initially started on IV heparin in the ED but INR noted to be 3.3 so will hold off until repeat in the morning.    Acute hypoxic  respiratory failure (HCC) -no documented hypoxic but is on 2L O2 for tachypnea.  -RSV + -CXR also concerning for left mid lobe pneumonia- unclear if superimposed bacterial pneumonia or from RSV. Check procalcitonin. She was given empiric IV Rocephin and Azithromycin in ED  Dementia with behavioral disturbance ([place]) -baseline pt alert and oriented to at least self and place. She is completely disoriented today with increasing agitation and combative with staff. -give low dose IV haldol.  Lactic acidosis -lactic of 2.5. Keep on continuous IV fluid and trend.  Hypokalemia -replete with IV potassium 52meq x2  RSV (respiratory syncytial virus pneumonia) -continue symptomatic management -PRN ipratropium and xopenex  Hypothyroidism -continue levothyroxine      Advance Care Planning:   Code Status: DNR -Confirmed with grand-son who is her HCPOA  Consults: None  Family Communication: grandson over the phone-pt appears quite  ill and combative on exam. Discussed with grandson that given her advanced age and ongoing respiratory and cardiac status her prognosis for this hospitalization is poor and remains guarded.   Severity of Illness: The appropriate patient status for this patient is INPATIENT. Inpatient status is judged to be reasonable and necessary in order to provide the required intensity of service to ensure the patient's safety. The patient's presenting symptoms, physical exam findings, and initial radiographic and laboratory data in the context of their chronic comorbidities is felt to place them at high risk for further clinical deterioration. Furthermore, it is not anticipated that the patient will be medically stable for discharge from the hospital within 2 midnights of admission.   * I certify that at the point of admission it is my clinical judgment that the patient will require inpatient hospital care spanning beyond 2 midnights from the point of admission due to high intensity  of service, high risk for further deterioration and high frequency of surveillance required.*  Author: Orene Desanctis, DO 05/05/2022 12:36 AM  For on call review [email or web address].

## 2022-05-04 NOTE — Sepsis Progress Note (Signed)
Elink monitoring for the code sepsis protocol.  

## 2022-05-05 ENCOUNTER — Inpatient Hospital Stay (HOSPITAL_COMMUNITY): Payer: Medicare Other

## 2022-05-05 DIAGNOSIS — J9601 Acute respiratory failure with hypoxia: Secondary | ICD-10-CM

## 2022-05-05 DIAGNOSIS — F03918 Unspecified dementia, unspecified severity, with other behavioral disturbance: Secondary | ICD-10-CM

## 2022-05-05 DIAGNOSIS — I4891 Unspecified atrial fibrillation: Secondary | ICD-10-CM

## 2022-05-05 DIAGNOSIS — E872 Acidosis, unspecified: Secondary | ICD-10-CM

## 2022-05-05 DIAGNOSIS — R609 Edema, unspecified: Secondary | ICD-10-CM | POA: Diagnosis not present

## 2022-05-05 DIAGNOSIS — E876 Hypokalemia: Secondary | ICD-10-CM

## 2022-05-05 LAB — CBC
HCT: 40.8 % (ref 36.0–46.0)
Hemoglobin: 13 g/dL (ref 12.0–15.0)
MCH: 31.9 pg (ref 26.0–34.0)
MCHC: 31.9 g/dL (ref 30.0–36.0)
MCV: 100.2 fL — ABNORMAL HIGH (ref 80.0–100.0)
Platelets: 166 10*3/uL (ref 150–400)
RBC: 4.07 MIL/uL (ref 3.87–5.11)
RDW: 14.4 % (ref 11.5–15.5)
WBC: 5.1 10*3/uL (ref 4.0–10.5)
nRBC: 0 % (ref 0.0–0.2)

## 2022-05-05 LAB — URINE CULTURE

## 2022-05-05 LAB — ECHOCARDIOGRAM COMPLETE
Height: 61 in
MV VTI: 1.35 cm2
S' Lateral: 2 cm
Weight: 2240 oz

## 2022-05-05 LAB — HEPARIN LEVEL (UNFRACTIONATED): Heparin Unfractionated: >1.1 IU/mL — ABNORMAL HIGH (ref 0.30–0.70)

## 2022-05-05 LAB — LACTIC ACID, PLASMA: Lactic Acid, Venous: 1.9 mmol/L (ref 0.5–1.9)

## 2022-05-05 LAB — PROTIME-INR
INR: 1.8 — ABNORMAL HIGH (ref 0.8–1.2)
Prothrombin Time: 20.8 seconds — ABNORMAL HIGH (ref 11.4–15.2)

## 2022-05-05 LAB — PROCALCITONIN: Procalcitonin: <0.1 ng/mL

## 2022-05-05 MED ORDER — LORAZEPAM 2 MG/ML IJ SOLN
0.5000 mg | Freq: Once | INTRAMUSCULAR | Status: AC
  Administered 2022-05-05: 0.5 mg via INTRAVENOUS
  Filled 2022-05-05: qty 1

## 2022-05-05 MED ORDER — APIXABAN 5 MG PO TABS
5.0000 mg | ORAL_TABLET | Freq: Two times a day (BID) | ORAL | Status: DC
  Administered 2022-05-05 – 2022-05-06 (×2): 5 mg via ORAL
  Filled 2022-05-05 (×2): qty 1

## 2022-05-05 MED ORDER — IPRATROPIUM-ALBUTEROL 0.5-2.5 (3) MG/3ML IN SOLN: 3.0000 mL | Freq: Four times a day (QID) | RESPIRATORY_TRACT | Status: DC

## 2022-05-05 MED ORDER — IPRATROPIUM-ALBUTEROL 0.5-2.5 (3) MG/3ML IN SOLN
3.0000 mL | Freq: Four times a day (QID) | RESPIRATORY_TRACT | Status: DC
  Administered 2022-05-05 – 2022-05-07 (×6): 3 mL via RESPIRATORY_TRACT
  Filled 2022-05-05 (×6): qty 3

## 2022-05-05 MED ORDER — METOPROLOL TARTRATE 12.5 MG HALF TABLET
12.5000 mg | ORAL_TABLET | Freq: Two times a day (BID) | ORAL | Status: DC
  Administered 2022-05-05: 12.5 mg via ORAL
  Filled 2022-05-05 (×2): qty 1

## 2022-05-05 NOTE — Plan of Care (Signed)
°  Problem: Clinical Measurements: °Goal: Respiratory complications will improve °Outcome: Progressing °Goal: Cardiovascular complication will be avoided °Outcome: Progressing °  °Problem: Coping: °Goal: Level of anxiety will decrease °Outcome: Progressing °  °Problem: Pain Managment: °Goal: General experience of comfort will improve °Outcome: Progressing °  °Problem: Safety: °Goal: Ability to remain free from injury will improve °Outcome: Progressing °  °

## 2022-05-05 NOTE — Progress Notes (Signed)
Right lower extremity venous duplex has been completed. Preliminary results can be found in CV Proc through chart review.   05/05/22 12:49 PM Olen Cordial RVT

## 2022-05-05 NOTE — ED Notes (Signed)
IV team at bedside 

## 2022-05-05 NOTE — Assessment & Plan Note (Signed)
-  continue symptomatic management -PRN ipratropium and xopenex

## 2022-05-05 NOTE — Progress Notes (Signed)
Echocardiogram 2D Echocardiogram has been performed.  Warren Lacy Jese Comella RDCS 05/05/2022, 1:06 PM

## 2022-05-05 NOTE — ED Notes (Signed)
Dr. Powell at bedside.  

## 2022-05-05 NOTE — Assessment & Plan Note (Signed)
-  replete with IV potassium 10meq x 2 

## 2022-05-05 NOTE — Sepsis Progress Note (Addendum)
Notified bedside nurse of need to draw repeat lactic acid.   Pt is not allowing staff to give care right now. Unable to collect LA. Will try again shortly

## 2022-05-05 NOTE — Assessment & Plan Note (Signed)
continue levothyroxine

## 2022-05-05 NOTE — ED Notes (Signed)
ED TO INPATIENT HANDOFF REPORT  ED Nurse Name and Phone #: Treylen Gibbs RN [phone]  S Name/Age/Gender Lynn Knox 88 y.o. female Room/Bed: 014C/014C  Code Status   Code Status: DNR  Home/SNF/Other Skilled nursing facility Patient oriented to: self Is this baseline? No   Triage Complete: Triage complete  Chief Complaint Atrial fibrillation with RVR ([place]) [I48.91]  Triage Note From facililty. Complain of flu-like symtoms. Cough, SOB, not feeling well.   EMS VS  148/46 BP 100 HR   Allergies Allergies  Allergen Reactions   Doxycycline     Pt states makes her eye red    Level of Care/Admitting Diagnosis ED Disposition     ED Disposition  Admit   Condition  --   [place]: [facility] [[id number]]  Level of Care: Telemetry Cardiac [[id number]]  May admit patient to [facility] or [facility] if equivalent level of care is available:: No  Covid Evaluation: Asymptomatic - no recent exposure (last 10 days) testing not required  Diagnosis: Atrial fibrillation with RVR [facility]) [[id number]]  Admitting Physician: Orene Desanctis [[id number]]  Attending Physician: Orene Desanctis [[id number]]  Certification:: I certify this patient will need inpatient services for at least 2 midnights  Estimated Length of Stay: 2          B Medical/Surgery History Past Medical History:  Diagnosis Date   Asthma    COPD (chronic obstructive pulmonary disease) ([place])    Hyperlipemia    Hypertension    Thyroid disease    Past Surgical History:  Procedure Laterality Date   INTRAMEDULLARY (IM) NAIL INTERTROCHANTERIC Left 11/10/2020   Procedure: INTRAMEDULLARY (IM) NAIL INTERTROCHANTRIC;  Surgeon: Rod Can, MD;  Location: [facility];  Service: Orthopedics;  Laterality: Left;   TONSILLECTOMY       A IV Location/Drains/Wounds Patient Lines/Drains/Airways Status     Active Line/Drains/Airways     Name Placement date Placement time Site Days   Peripheral IV 05/05/22 22 G 1.75"  Anterior;Right Forearm 05/05/22  1114  Forearm  less than 1   Incision (Closed) 11/10/20 Hip Left 11/10/20  1621  -- 541            Intake/Output Last 24 hours  Intake/Output Summary (Last 24 hours) at 05/05/2022 1358 Last data filed at 05/05/2022 0654 Gross per 24 hour  Intake 1299.03 ml  Output --  Net 1299.03 ml    Labs/Imaging Results for orders placed or performed during the hospital encounter of 05/04/22 (from the past 48 hour(s))  Comprehensive metabolic panel     Status: Abnormal   Collection Time: 05/04/22  7:00 PM  Result Value Ref Range   Sodium 144 135 - 145 mmol/L   Potassium 3.2 (L) 3.5 - 5.1 mmol/L   Chloride 103 98 - 111 mmol/L   CO2 30 22 - 32 mmol/L   Glucose, Bld 125 (H) 70 - 99 mg/dL    Comment: Glucose reference range applies only to samples taken after fasting for at least 8 hours.   BUN 14 8 - 23 mg/dL   Creatinine, Ser 0.87 0.44 - 1.00 mg/dL   Calcium 9.1 8.9 - 10.3 mg/dL   Total Protein 7.2 6.5 - 8.1 g/dL   Albumin 3.5 3.5 - 5.0 g/dL   AST 39 15 - 41 U/L   ALT 23 0 - 44 U/L   Alkaline Phosphatase 87 38 - 126 U/L   Total Bilirubin 0.7 0.3 - 1.2 mg/dL   GFR, Estimated >60 >  60 mL/min    Comment: (NOTE) Calculated using the CKD-EPI Creatinine Equation (2021)    Anion gap 11 5 - 15    Comment: Performed at [facility], [place] [address] [place], [place] [address]  CBC with Differential     Status: Abnormal   Collection Time: 05/04/22  7:00 PM  Result Value Ref Range   WBC 6.0 4.0 - 10.5 K/uL   RBC 4.48 3.87 - 5.11 MIL/uL   Hemoglobin 14.8 12.0 - 15.0 g/dL   HCT 44.4 36.0 - 46.0 %   MCV 99.1 80.0 - 100.0 fL   MCH 33.0 26.0 - 34.0 pg   MCHC 33.3 30.0 - 36.0 g/dL   RDW 14.3 11.5 - 15.5 %   Platelets 188 150 - 400 K/uL   nRBC 0.0 0.0 - 0.2 %   Neutrophils Relative % 45 %   Neutro Abs 2.7 1.7 - 7.7 K/uL   Lymphocytes Relative 25 %   Lymphs Abs 1.5 0.7 - 4.0 K/uL   Monocytes Relative 23 %   Monocytes Absolute 1.4 (H) 0.1 - 1.0 K/uL    Eosinophils Relative 5 %   Eosinophils Absolute 0.3 0.0 - 0.5 K/uL   Basophils Relative 2 %   Basophils Absolute 0.1 0.0 - 0.1 K/uL   Immature Granulocytes 0 %   Abs Immature Granulocytes 0.02 0.00 - 0.07 K/uL    Comment: Performed at [facility] [address], [place], [place] [address]  Protime-INR     Status: Abnormal   Collection Time: 05/04/22  7:00 PM  Result Value Ref Range   Prothrombin Time 33.5 (H) 11.4 - 15.2 seconds   INR 3.3 (H) 0.8 - 1.2    Comment: (NOTE) INR goal varies based on device and disease states. Performed at [facility], [place] [address], [place], [place] [address]   APTT     Status: Abnormal   Collection Time: 05/04/22  7:00 PM  Result Value Ref Range   aPTT 42 (H) 24 - 36 seconds    Comment:        IF BASELINE aPTT IS ELEVATED, SUGGEST PATIENT RISK ASSESSMENT BE USED TO DETERMINE APPROPRIATE ANTICOAGULANT THERAPY. Performed at [facility], [place] [address] [place], [place] [address]   Urinalysis, Routine w reflex microscopic Anterior Nasal Swab     Status: Abnormal   Collection Time: 05/04/22  7:00 PM  Result Value Ref Range   Color, Urine YELLOW YELLOW   APPearance CLOUDY (A) CLEAR   Specific Gravity, Urine 1.015 1.005 - 1.030   pH 7.0 5.0 - 8.0   Glucose, UA NEGATIVE NEGATIVE mg/dL   Hgb urine dipstick SMALL (A) NEGATIVE   Bilirubin Urine NEGATIVE NEGATIVE   Ketones, ur NEGATIVE NEGATIVE mg/dL   Protein, ur NEGATIVE NEGATIVE mg/dL   Nitrite NEGATIVE NEGATIVE   Leukocytes,Ua MODERATE (A) NEGATIVE   RBC / HPF 21-50 0 - 5 RBC/hpf   WBC, UA 21-50 0 - 5 WBC/hpf   Bacteria, UA NONE SEEN NONE SEEN   Squamous Epithelial / LPF 0-5 0 - 5 /HPF   Amorphous Crystal PRESENT     Comment: Performed at [facility], [place] [address], [place], [place] [address]  Troponin I (High Sensitivity)     Status: None   Collection Time: 05/04/22  7:00 PM  Result Value Ref Range   Troponin I (High Sensitivity) 14 <18 ng/L    Comment:  (NOTE) Elevated high sensitivity troponin I (hsTnI)  values and significant  changes across serial measurements may suggest ACS but many other  chronic and acute conditions are known to elevate hsTnI results.  Refer to the "Links" section for chest pain algorithms and additional  guidance. Performed at [facility], [place] [address], [place], [place] [address]   Brain natriuretic peptide     Status: Abnormal   Collection Time: 05/04/22  7:01 PM  Result Value Ref Range   B Natriuretic Peptide 134.7 (H) 0.0 - 100.0 pg/mL    Comment: Performed at [facility] [address], [place], [place] [address]  TSH     Status: None   Collection Time: 05/04/22  7:01 PM  Result Value Ref Range   TSH 2.487 0.350 - 4.500 uIU/mL    Comment: Performed by a 3rd Generation assay with a functional sensitivity of <=0.01 uIU/mL. Performed at [facility], [place] [address], [place], [name] [address]   Resp panel by RT-PCR (RSV, Flu A&B, Covid) Anterior Nasal Swab     Status: Abnormal   Collection Time: 05/04/22  7:06 PM   Specimen: Anterior Nasal Swab  Result Value Ref Range   SARS Coronavirus 2 by RT PCR NEGATIVE NEGATIVE    Comment: (NOTE) SARS-CoV-2 target nucleic acids are NOT DETECTED.  The SARS-CoV-2 RNA is generally detectable in upper respiratory specimens during the acute phase of infection. The lowest concentration of SARS-CoV-2 viral copies this assay can detect is 138 copies/mL. A negative result does not preclude SARS-Cov-2 infection and should not be used as the sole basis for treatment or other patient management decisions. A negative result may occur with  improper specimen collection/handling, submission of specimen other than nasopharyngeal swab, presence of viral mutation(s) within the areas targeted by this assay, and inadequate number of viral copies(<138 copies/mL). A negative result must be combined with clinical observations, patient history, and  epidemiological information. The expected result is Negative.  Fact Sheet for Patients:  EntrepreneurPulse.com.au  Fact Sheet for Healthcare Providers:  IncredibleEmployment.be  This test is no t yet approved or cleared by the [place] FDA and  has been authorized for detection and/or diagnosis of SARS-CoV-2 by FDA under an Emergency Use Authorization (EUA). This EUA will remain  in effect (meaning this test can be used) for the duration of the COVID-19 declaration under Section 564(b)(1) of the Act, 21 U.S.C.section 360bbb-3(b)(1), unless the authorization is terminated  or revoked sooner.       Influenza A by PCR NEGATIVE NEGATIVE   Influenza B by PCR NEGATIVE NEGATIVE    Comment: (NOTE) The Xpert Xpress SARS-CoV-2/FLU/RSV plus assay is intended as an aid in the diagnosis of influenza from Nasopharyngeal swab specimens and should not be used as a sole basis for treatment. Nasal washings and aspirates are unacceptable for Xpert Xpress SARS-CoV-2/FLU/RSV testing.  Fact Sheet for Patients: EntrepreneurPulse.com.au  Fact Sheet for Healthcare Providers: IncredibleEmployment.be  This test is not yet approved or cleared by the [place] FDA and has been authorized for detection and/or diagnosis of SARS-CoV-2 by FDA under an Emergency Use Authorization (EUA). This EUA will remain in effect (meaning this test can be used) for the duration of the COVID-19 declaration under Section 564(b)(1) of the Act, 21 U.S.C. section 360bbb-3(b)(1), unless the authorization is terminated or revoked.     Resp Syncytial Virus by PCR POSITIVE (A) NEGATIVE    Comment: (NOTE) Fact Sheet for Patients: EntrepreneurPulse.com.au  Fact Sheet for Healthcare Providers: IncredibleEmployment.be  This test  is not yet approved or cleared by the [place] and has been authorized for  detection and/or diagnosis of SARS-CoV-2 by FDA under an Emergency Use Authorization (EUA). This EUA will remain in effect (meaning this test can be used) for the duration of the COVID-19 declaration under Section 564(b)(1) of the Act, 21 U.S.C. section 360bbb-3(b)(1), unless the authorization is terminated or revoked.  Performed at [facility], [place] [address] [place], [place] [address]   Lactic acid, plasma     Status: Abnormal   Collection Time: 05/04/22  9:00 PM  Result Value Ref Range   Lactic Acid, Venous 2.5 (HH) 0.5 - 1.9 mmol/L    Comment: ATTEMPTED CALL TO [phone] 05/04/22 [place]. NO RING, STRAIGHT TO FRONT DESK ATTEMPTED CALL TO [phone] 05/04/22 [place] NO RING, STRAIGHT TO FRONT DESK CRITICAL RESULT CALLED TO, READ BACK BY AND VERIFIED WITH MELANIE FOWLER RN 05/04/22 2301 [place] Ke Performed at [facility], [place] [address] [place], [place] [address]   Troponin I (High Sensitivity)     Status: None   Collection Time: 05/04/22  9:01 PM  Result Value Ref Range   Troponin I (High Sensitivity) 16 <18 ng/L    Comment: (NOTE) Elevated high sensitivity troponin I (hsTnI) values and significant  changes across serial measurements may suggest ACS but many other  chronic and acute conditions are known to elevate hsTnI results.  Refer to the "Links" section for chest pain algorithms and additional  guidance. Performed at [facility], [place] [address], [place], [place] [address]   Procalcitonin - Baseline     Status: None   Collection Time: 05/05/22  1:59 AM  Result Value Ref Range   Procalcitonin <0.10 ng/mL    Comment:        Interpretation: PCT (Procalcitonin) <= 0.5 ng/mL: Systemic infection (sepsis) is not likely. Local bacterial infection is possible. (NOTE)       Sepsis PCT Algorithm           Lower Respiratory Tract                                      Infection PCT Algorithm    ----------------------------      ----------------------------         PCT < 0.25 ng/mL                PCT < 0.10 ng/mL          Strongly encourage             Strongly discourage   discontinuation of antibiotics    initiation of antibiotics    ----------------------------     -----------------------------       PCT 0.25 - 0.50 ng/mL            PCT 0.10 - 0.25 ng/mL               OR       >80% decrease in PCT            Discourage initiation of                                            antibiotics      Encourage discontinuation  of antibiotics    ----------------------------     -----------------------------         PCT >= 0.50 ng/mL              PCT 0.26 - 0.50 ng/mL               AND        <80% decrease in PCT             Encourage initiation of                                             antibiotics       Encourage continuation           of antibiotics    ----------------------------     -----------------------------        PCT >= 0.50 ng/mL                  PCT > 0.50 ng/mL               AND         increase in PCT                  Strongly encourage                                      initiation of antibiotics    Strongly encourage escalation           of antibiotics                                     -----------------------------                                           PCT <= 0.25 ng/mL                                                 OR                                        > 80% decrease in PCT                                      Discontinue / Do not initiate                                             antibiotics  Performed at [facility], [address] [place], [place] [address]   Lactic acid, plasma     Status: None   Collection Time: 05/05/22  2:00 AM  Result Value Ref Range   Lactic Acid, Venous 1.9 0.5 -  1.9 mmol/L    Comment: Performed at [facility], [place] [address], [place], [place] [address]  CBC     Status: Abnormal   Collection Time: 05/05/22  5:20 AM   Result Value Ref Range   WBC 5.1 4.0 - 10.5 K/uL   RBC 4.07 3.87 - 5.11 MIL/uL   Hemoglobin 13.0 12.0 - 15.0 g/dL   HCT 40.8 36.0 - 46.0 %   MCV 100.2 (H) 80.0 - 100.0 fL   MCH 31.9 26.0 - 34.0 pg   MCHC 31.9 30.0 - 36.0 g/dL   RDW 14.4 11.5 - 15.5 %   Platelets 166 150 - 400 K/uL   nRBC 0.0 0.0 - 0.2 %    Comment: Performed at [facility], [place] [address] [place], [place] [address]  Protime-INR     Status: Abnormal   Collection Time: 05/05/22  5:20 AM  Result Value Ref Range   Prothrombin Time 20.8 (H) 11.4 - 15.2 seconds   INR 1.8 (H) 0.8 - 1.2    Comment: (NOTE) INR goal varies based on device and disease states. Performed at [facility], [place] [address] [place], [place] [address]   Heparin level (unfractionated)     Status: Abnormal   Collection Time: 05/05/22  9:58 AM  Result Value Ref Range   Heparin Unfractionated >1.10 (H) 0.30 - 0.70 IU/mL    Comment: (NOTE) The clinical reportable range upper limit is being lowered to >1.10 to align with the FDA approved guidance for the current laboratory assay.  If heparin results are below expected values, and patient dosage has  been confirmed, suggest follow up testing of antithrombin III levels. Performed at [facility], [place] [address], [place], [place] [address]    ECHOCARDIOGRAM COMPLETE  Result Date: 05/05/2022    ECHOCARDIOGRAM REPORT   Patient Name:   [facility] Date of Exam: 05/05/2022 Medical Rec #:  [id number]  Height:       61.0 in Accession #:    [id number] Weight:       140.0 lb Date of Birth:  03-19-1925  BSA:          1.623 m Patient Age:    89 years   BP:           141/93 mmHg Patient Gender: F          HR:           113 bpm. Exam Location:  Inpatient Procedure: 2D Echo, Color Doppler and Cardiac Doppler Indications:    I48.91* Unspecified atrial fibrillation  History:        Patient has prior history of Echocardiogram examinations, most                 recent 11/11/2020. COPD,  Arrythmias:Atrial Fibrillation; Risk                 Factors:Hypertension.  Sonographer:    Raquel Sarna Senior RDCS Referring Phys: [phone] A CALDWELL POWELL JR  Sonographer Comments: Difficult study due to respiratory interference and thin body habitus. IMPRESSIONS  1. Left ventricular ejection fraction, by estimation, is 65 to 70%. The left ventricle has normal function. The left ventricle has no regional wall motion abnormalities. There is mild asymmetric left ventricular hypertrophy of the basal-septal segment. Diastolic function indeterminant due to severe MAC.  2. Right ventricular systolic function is mildly reduced. The right ventricular size is normal. There is mildly elevated pulmonary artery systolic pressure. The estimated  right ventricular systolic pressure is 79.8 mmHg.  3. Left atrial size was mildly dilated.  4. Right atrial size was mildly dilated.  5. The mitral valve is degenerative. Mild mitral valve regurgitation. Mild calcific mitral stenosis. The mean mitral valve gradient is 6.0 mmHg at HR 114. Severe mitral annular calcification.  6. The aortic valve is tricuspid. There is moderate calcification of the aortic valve. There is moderate thickening of the aortic valve. Aortic valve regurgitation is mild to moderate. Aortic valve sclerosis/calcification is present, without any evidence of aortic stenosis.  7. The inferior vena cava is dilated in size with <50% respiratory variability, suggesting right atrial pressure of 15 mmHg. Comparison(s): Compared to prior TTE on 11/2020, RAP is now 55mHg (previously 330mg). Otherwise, no significant change. FINDINGS  Left Ventricle: Left ventricular ejection fraction, by estimation, is 65 to 70%. The left ventricle has normal function. The left ventricle has no regional wall motion abnormalities. The left ventricular internal cavity size was small. There is mild asymmetric left ventricular hypertrophy of the basal-septal segment. Diastolic function indeterminant  due to severe MAC. Right Ventricle: The right ventricular size is normal. Right vetricular wall thickness was not well visualized. Right ventricular systolic function is mildly reduced. There is mildly elevated pulmonary artery systolic pressure. The tricuspid regurgitant velocity is 2.66 m/s, and with an assumed right atrial pressure of 15 mmHg, the estimated right ventricular systolic pressure is 4392.1mHg. Left Atrium: Left atrial size was mildly dilated. Right Atrium: Right atrial size was mildly dilated. Pericardium: There is no evidence of pericardial effusion. Mitral Valve: The mitral valve is degenerative in appearance. There is moderate thickening of the mitral valve leaflet(s). There is moderate calcification of the mitral valve leaflet(s). Severe mitral annular calcification. Mild mitral valve regurgitation. Mild mitral valve stenosis. MV peak gradient, 11.4 mmHg. The mean mitral valve gradient is 6.0 mmHg. Tricuspid Valve: The tricuspid valve is normal in structure. Tricuspid valve regurgitation is mild. Aortic Valve: The aortic valve is tricuspid. There is moderate calcification of the aortic valve. There is moderate thickening of the aortic valve. Aortic valve regurgitation is mild to moderate. Aortic valve sclerosis/calcification is present, without any evidence of aortic stenosis. Pulmonic Valve: The pulmonic valve was grossly normal. Pulmonic valve regurgitation is mild. Aorta: The aortic root is normal in size and structure. Venous: The inferior vena cava is dilated in size with less than 50% respiratory variability, suggesting right atrial pressure of 15 mmHg. IAS/Shunts: The atrial septum is grossly normal.  LEFT VENTRICLE PLAX 2D LVIDd:         3.20 cm LVIDs:         2.00 cm LV PW:         0.80 cm LV IVS:        1.00 cm LVOT diam:     1.80 cm LV SV:         35 LV SV Index:   22 LVOT Area:     2.54 cm  RIGHT VENTRICLE RV S prime:     9.03 cm/s TAPSE (M-mode): 1.4 cm LEFT ATRIUM             Index         RIGHT ATRIUM           Index LA diam:        3.70 cm 2.28 cm/m   RA Area:     14.80 cm LA Vol (A2C):   61.0 ml 37.58 ml/m  RA Volume:  31.00 ml  19.10 ml/m LA Vol (A4C):   49.5 ml 30.50 ml/m LA Biplane Vol: 61.1 ml 37.64 ml/m  AORTIC VALVE LVOT Vmax:   85.90 cm/s LVOT Vmean:  57.700 cm/s LVOT VTI:    0.139 m  AORTA Ao Root diam: 3.00 cm MITRAL VALVE              TRICUSPID VALVE MV Area VTI:  1.35 cm    TR Peak grad:   28.3 mmHg MV Peak grad: 11.4 mmHg   TR Vmax:        266.00 cm/s MV Mean grad: 6.0 mmHg MV Vmax:      1.69 m/s    SHUNTS MV Vmean:     110.0 cm/s  Systemic VTI:  0.14 m                           Systemic Diam: 1.80 cm Gwyndolyn Kaufman MD Electronically signed by Gwyndolyn Kaufman MD Signature Date/Time: 05/05/2022/1:17:26 PM    Final    VAS [place] LOWER EXTREMITY VENOUS (DVT)  Result Date: 05/05/2022  Lower Venous DVT Study Patient Name:  Lynn Knox  Date of Exam:   05/05/2022 Medical Rec #: [id number]   Accession #:    [id number] Date of Birth: 15-Mar-1925   Patient Gender: F Patient Age:   76 years Exam [facility]  [facility] Procedure:      VAS [place] LOWER EXTREMITY VENOUS (DVT) Referring Phys: A POWELL JR --------------------------------------------------------------------------------  Indications: Edema.  Risk Factors: None identified. Limitations: Poor ultrasound/tissue interface and patient positioning, patient movement. Comparison Study: No prior studies. Performing Technologist: Oliver Hum RVT  Examination Guidelines: A complete evaluation includes B-mode imaging, spectral Doppler, color Doppler, and power Doppler as needed of all accessible portions of each vessel. Bilateral testing is considered an integral part of a complete examination. Limited examinations for reoccurring indications may be performed as noted. The reflux portion of the exam is performed with the patient in reverse Trendelenburg.   +---------+---------------+---------+-----------+----------+--------------+ RIGHT    CompressibilityPhasicitySpontaneityPropertiesThrombus Aging +---------+---------------+---------+-----------+----------+--------------+ CFV      Full           Yes      Yes                                 +---------+---------------+---------+-----------+----------+--------------+ SFJ      Full                                                        +---------+---------------+---------+-----------+----------+--------------+ FV Prox  Full                                                        +---------+---------------+---------+-----------+----------+--------------+ FV Mid   Full                                                        +---------+---------------+---------+-----------+----------+--------------+ FV DistalFull                                                        +---------+---------------+---------+-----------+----------+--------------+  PFV      Full                                                        +---------+---------------+---------+-----------+----------+--------------+ POP      Full           Yes      Yes                                 +---------+---------------+---------+-----------+----------+--------------+ PTV      Full                                                        +---------+---------------+---------+-----------+----------+--------------+ PERO     Full                                                        +---------+---------------+---------+-----------+----------+--------------+   +----+---------------+---------+-----------+----------+--------------+ LEFTCompressibilityPhasicitySpontaneityPropertiesThrombus Aging +----+---------------+---------+-----------+----------+--------------+ CFV Full           Yes      Yes                                  +----+---------------+---------+-----------+----------+--------------+    Summary: RIGHT: - There is no evidence of deep vein thrombosis in the lower extremity. However, portions of this examination were limited- see technologist comments above.  - No cystic structure found in the popliteal fossa.  LEFT: - No evidence of common femoral vein obstruction.  *See table(s) above for measurements and observations.    Preliminary    DG Chest Port 1 View  Result Date: 05/04/2022 CLINICAL DATA:  Cough and flu like symptoms EXAM: PORTABLE CHEST 1 VIEW COMPARISON:  11/10/2020 FINDINGS: Cardiac shadow is within normal limits. Aortic calcifications and mitral annular patchy airspace opacity is noted in the left mid lung likely representing early infiltrate. This is somewhat obscured by overlying leads. No bony abnormality is noted. IMPRESSION: Patchy left mid lung airspace opacity. Electronically Signed   By: Inez Catalina M.D.   On: 05/04/2022 19:33    Pending Labs Unresulted Labs (From admission, onward)     Start     Ordered   05/06/22 0500  Heparin level (unfractionated)  Daily,   R     See Hyperspace for full Linked Orders Report.   05/04/22 2315   05/06/22 0500  CBC with Differential/Platelet  Tomorrow morning,   R        05/05/22 0956   05/06/22 0500  Comprehensive metabolic panel  Tomorrow morning,   R        05/05/22 0956   05/06/22 0500  Magnesium  Tomorrow morning,   R        05/05/22 0956   05/06/22 0500  Phosphorus  Tomorrow morning,   R        05/05/22 0956   05/05/22 0500  CBC  Daily,   R  See Hyperspace for full Linked Orders Report.   05/04/22 2315   05/04/22 1900  Lactic acid, plasma  (Undifferentiated presentation (screening labs and basic nursing orders))  Now then every 2 hours,   R      05/04/22 1900   05/04/22 1900  Blood Culture (routine x 2)  (Undifferentiated presentation (screening labs and basic nursing orders))  BLOOD CULTURE X 2,   STAT      05/04/22 1900   05/04/22  1900  Urine Culture  (Undifferentiated presentation (screening labs and basic nursing orders))  ONCE - URGENT,   URGENT       Question:  Indication  Answer:  Dysuria   05/04/22 1900            Vitals/Pain Today's Vitals   05/05/22 1100 05/05/22 1130 05/05/22 1315 05/05/22 1321  BP: (!) 149/83 (!) 141/93    Pulse: 97 99 (!) 104   Resp: 18 20 (!) 21   Temp: 97.9 F (36.6 C)   98.3 F (36.8 C)  TempSrc:    Oral  SpO2: 95% 91% 92%   Weight:      Height:      PainSc:        Isolation Precautions No active isolations  Medications Medications  levalbuterol (XOPENEX) nebulizer solution 0.63 mg (0.63 mg Nebulization Given 05/05/22 1318)  ipratropium-albuterol (DUONEB) 0.5-2.5 (3) MG/3ML nebulizer solution 3 mL (3 mLs Nebulization Given 05/05/22 1134)  metoprolol tartrate (LOPRESSOR) tablet 12.5 mg (12.5 mg Oral Not Given 05/05/22 1211)  apixaban (ELIQUIS) tablet 5 mg (has no administration in time range)  lactated ringers bolus 1,000 mL (0 mLs Intravenous Stopped 05/04/22 2105)  cefTRIAXone (ROCEPHIN) 1 g in sodium chloride 0.9 % 100 mL IVPB (0 g Intravenous Stopped 05/04/22 2105)  azithromycin (ZITHROMAX) 500 mg in sodium chloride 0.9 % 250 mL IVPB (0 mg Intravenous Stopped 05/04/22 2241)  metoprolol tartrate (LOPRESSOR) injection 5 mg (5 mg Intravenous Given 05/04/22 2044)  metoprolol tartrate (LOPRESSOR) injection 5 mg (5 mg Intravenous Given 05/04/22 2231)  potassium chloride 10 mEq in 100 mL IVPB (0 mEq Intravenous Stopped 05/05/22 0235)  magnesium sulfate IVPB 1 g 100 mL (0 g Intravenous Stopped 05/05/22 0159)  haloperidol lactate (HALDOL) 5 MG/ML injection (2 mg  Given 05/05/22 0001)  LORazepam (ATIVAN) injection 0.5 mg (0.5 mg Intravenous Given 05/05/22 0104)    Mobility walks with device High fall risk   Focused Assessments Cardiac Assessment Handoff:  Cardiac Rhythm: Atrial fibrillation No results found for: "CKTOTAL", "CKMB", "CKMBINDEX", "TROPONINI" No  results found for: "DDIMER" Does the Patient currently have chest pain? No   , Neuro Assessment Handoff:  Swallow screen pass? Yes  Cardiac Rhythm: Atrial fibrillation       Neuro Assessment: Exceptions to [facility] Neuro Checks:      Last Documented NIHSS Modified Score:   Has TPA been given? No If patient is a Neuro Trauma and patient is going to OR before floor call report to [facility] nurse: [phone] or [phone]  , Pulmonary Assessment Handoff:  Lung sounds: Bilateral Breath Sounds: Expiratory wheezes L Breath Sounds: Expiratory wheezes R Breath Sounds: Expiratory wheezes O2 Device: Room Air      R Recommendations: See Admitting Provider Note  Report given to:   Additional Notes:

## 2022-05-05 NOTE — ED Notes (Signed)
Patient transported to echo at this time 

## 2022-05-05 NOTE — ED Notes (Signed)
Pt yelling in room; pt had removed IV; pt cleaned, dressing placed at IV site; gown changed; pt not redirectable at this time; green mitts placed on pt for pt safety and to protect remaining lines and monitoring devices; will continue to monitor as able

## 2022-05-05 NOTE — Assessment & Plan Note (Signed)
-  lactic of 2.5. Keep on continuous IV fluid and trend.

## 2022-05-05 NOTE — Progress Notes (Signed)
PROGRESS NOTE    Lynn Knox  [id number] DOB: 02-11-1925 DOA: 05/04/2022 PCP: [facility], [facility]  Chief Complaint  Patient presents with   Cough        Shortness of Breath    Brief Narrative:   Lynn Knox is Lynn Knox 88 y.o. female with medical history significant of dementia, atrial fibrillation, COPD, HTN, hypothyroidism who presents with increase shortness of breath and cough.   Assessment & Plan:   Principal Problem:   Atrial fibrillation with RVR (HCC) Active Problems:   Hypertension   Hypothyroidism   RSV (respiratory syncytial virus pneumonia)   Hypokalemia   Lactic acidosis   Dementia with behavioral disturbance (HCC)   Acute hypoxic respiratory failure (HCC)  Atrial fibrillation with RVR (HCC) -HR up to 140 on presentation. Improved with IV Lopressor x2 in ED.  -TSH within normal limits -likely exacerbated by RSV -Grandson is not aware of her being on anticoagulation but in the past she was noted to be on Xarelto and later Eliquis. Med rec sent over by SNF unclear of whether she is still on any. Her CHA2DS2-VASc score is 4 and I discussed risk and benefit with Grandson and he would like her to receive anticoagulation since she is mostly wheelchair bound and no hx of bleeds.  -will need to review her hx to determine if any particular reason for discontinuation of anticoagulation -start metop 12.5 mg BID -repeat echo   Acute hypoxic respiratory failure (HCC)  RSV infection -currently on RA -RSV + -CXR with patchy L mid lung airspace opacity -negative procalcitonin, normal WBC count, afebrile -received abx in ED, but suspect this represents viral pneumonia related to RSV - hold further abx and follow    Dementia with behavioral disturbance ([name]) -baseline pt alert and oriented to at least self and place. She is completely disoriented today with increasing agitation and combative with staff. -due to above -delirium precautions -will try to  limit antipsychotics as possible    Lactic acidosis -resolved   Hypokalemia -replace and follow   RSV (respiratory syncytial virus pneumonia) -continue symptomatic management -duonebs, prn xopenex   Hypothyroidism -continue levothyroxine, TSH wnl  Right Lower Extremity Edema - follow [place]     DVT prophylaxis: heparin gtt on hold overnight due to elevated INR Code Status: DNR Family Communication: called grandson Disposition:   Status is: Inpatient Remains inpatient appropriate because: continued RVR   Consultants:  none  Procedures:  none  Antimicrobials:  Anti-infectives (From admission, onward)    Start     Dose/Rate Route Frequency Ordered Stop   05/04/22 1945  cefTRIAXone (ROCEPHIN) 1 g in sodium chloride 0.9 % 100 mL IVPB        1 g 200 mL/hr over 30 Minutes Intravenous  Once 05/04/22 1937 05/04/22 2105   05/04/22 1945  azithromycin (ZITHROMAX) 500 mg in sodium chloride 0.9 % 250 mL IVPB        500 mg 250 mL/hr over 60 Minutes Intravenous  Once 05/04/22 1937 05/04/22 2241       Subjective: Sleeping at the time of my evaluation  Objective: Vitals:   05/05/22 0700 05/05/22 0830 05/05/22 0930 05/05/22 0931  BP: 128/83 138/71 137/79   Pulse: (!) 115 (!) 102 (!) 112 (!) 103  Resp: 20 18 18  (!) 22  Temp: 98 F (36.7 C)     TempSrc:      SpO2: 93% 95% 96% 97%  Weight:      Height:  Intake/Output Summary (Last 24 hours) at 05/05/2022 0938 Last data filed at 05/05/2022 0654 Gross per 24 hour  Intake 1299.03 ml  Output --  Net 1299.03 ml   Filed Weights   05/04/22 2300  Weight: 63.5 kg    Examination:  General exam: Appears calm and comfortable  Respiratory system: Clear to auscultation. Respiratory effort normal. Cardiovascular system: irregularly irregular, mild tachycardia Gastrointestinal system: Abdomen is nondistended, soft and nontender.  Central nervous system: sleeping Extremities: no LEE    Data Reviewed: I have  personally reviewed following labs and imaging studies  CBC: Recent Labs  Lab 05/04/22 1900 05/05/22 0520  WBC 6.0 5.1  NEUTROABS 2.7  --   HGB 14.8 13.0  HCT 44.4 40.8  MCV 99.1 100.2*  PLT 188 166    Basic Metabolic Panel: Recent Labs  Lab 05/04/22 1900  NA 144  K 3.2*  CL 103  CO2 30  GLUCOSE 125*  BUN 14  CREATININE 0.87  CALCIUM 9.1    GFR: Estimated Creatinine Clearance: 33 mL/min (by C-G formula based on SCr of 0.87 mg/dL).  Liver Function Tests: Recent Labs  Lab 05/04/22 1900  AST 39  ALT 23  ALKPHOS 87  BILITOT 0.7  PROT 7.2  ALBUMIN 3.5    CBG: No results for input(s): "GLUCAP" in the last 168 hours.   Recent Results (from the past 240 hour(s))  Resp panel by RT-PCR (RSV, Flu Lynn Knox&B, Covid) Anterior Nasal Swab     Status: Abnormal   Collection Time: 05/04/22  7:06 PM   Specimen: Anterior Nasal Swab  Result Value Ref Range Status   SARS Coronavirus 2 by RT PCR NEGATIVE NEGATIVE Final    Comment: (NOTE) SARS-CoV-2 target nucleic acids are NOT DETECTED.  The SARS-CoV-2 RNA is generally detectable in upper respiratory specimens during the acute phase of infection. The lowest concentration of SARS-CoV-2 viral copies this assay can detect is 138 copies/mL. Lynn Knox negative result does not preclude SARS-Cov-2 infection and should not be used as the sole basis for treatment or other patient management decisions. Lynn Knox negative result may occur with  improper specimen collection/handling, submission of specimen other than nasopharyngeal swab, presence of viral mutation(s) within the areas targeted by this assay, and inadequate number of viral copies(<138 copies/mL). Lynn Knox negative result must be combined with clinical observations, patient history, and epidemiological information. The expected result is Negative.  Fact Sheet for Patients:  BloggerCourse.com  Fact Sheet for Healthcare Providers:   SeriousBroker.it  This test is no t yet approved or cleared by the [place] FDA and  has been authorized for detection and/or diagnosis of SARS-CoV-2 by FDA under an Emergency Use Authorization (EUA). This EUA will remain  in effect (meaning this test can be used) for the duration of the COVID-19 declaration under Section 564(b)(1) of the Act, 21 U.S.C.section 360bbb-3(b)(1), unless the authorization is terminated  or revoked sooner.       Influenza Lynn Knox by PCR NEGATIVE NEGATIVE Final   Influenza B by PCR NEGATIVE NEGATIVE Final    Comment: (NOTE) The Xpert Xpress SARS-CoV-2/FLU/RSV plus assay is intended as an aid in the diagnosis of influenza from Nasopharyngeal swab specimens and should not be used as Lynn Knox sole basis for treatment. Nasal washings and aspirates are unacceptable for Xpert Xpress SARS-CoV-2/FLU/RSV testing.  Fact Sheet for Patients: BloggerCourse.com  Fact Sheet for Healthcare Providers: SeriousBroker.it  This test is not yet approved or cleared by the [place] FDA and has been authorized for detection and/or  diagnosis of SARS-CoV-2 by FDA under an Emergency Use Authorization (EUA). This EUA will remain in effect (meaning this test can be used) for the duration of the COVID-19 declaration under Section 564(b)(1) of the Act, 21 U.S.C. section 360bbb-3(b)(1), unless the authorization is terminated or revoked.     Resp Syncytial Virus by PCR POSITIVE (Lynn Knox) NEGATIVE Final    Comment: (NOTE) Fact Sheet for Patients: EntrepreneurPulse.com.au  Fact Sheet for Healthcare Providers: IncredibleEmployment.be  This test is not yet approved or cleared by the [place] FDA and has been authorized for detection and/or diagnosis of SARS-CoV-2 by FDA under an Emergency Use Authorization (EUA). This EUA will remain in effect (meaning this test can be used)  for the duration of the COVID-19 declaration under Section 564(b)(1) of the Act, 21 U.S.C. section 360bbb-3(b)(1), unless the authorization is terminated or revoked.  Performed at [facility], [address] [place], [place] [address]          Radiology Studies: DG Chest Port 1 View  Result Date: 05/04/2022 CLINICAL DATA:  Cough and flu like symptoms EXAM: PORTABLE CHEST 1 VIEW COMPARISON:  11/10/2020 FINDINGS: Cardiac shadow is within normal limits. Aortic calcifications and mitral annular patchy airspace opacity is noted in the left mid lung likely representing early infiltrate. This is somewhat obscured by overlying leads. No bony abnormality is noted. IMPRESSION: Patchy left mid lung airspace opacity. Electronically Signed   By: Inez Catalina M.D.   On: 05/04/2022 19:33        Scheduled Meds:  haloperidol lactate  2 mg Intravenous Once   Continuous Infusions:  lactated ringers Stopped (05/05/22 0654)     LOS: 1 day    Time spent: over 30 min    Fayrene Helper, MD [facility]   To contact the attending provider between 7A-7P or the covering provider during after hours 7P-7A, please log into the web site [email or web address] and access using universal [place] password for that web site. If you do not have the password, please call the hospital operator.  05/05/2022, 9:38 AM

## 2022-05-05 NOTE — Assessment & Plan Note (Addendum)
-  baseline pt alert and oriented to at least self and place. She is completely disoriented today with increasing agitation and combative with staff. -give low dose IV haldol.

## 2022-05-05 NOTE — Assessment & Plan Note (Signed)
-  no documented hypoxic but is on 2L O2 for tachypnea.  -RSV + -CXR also concerning for left mid lobe pneumonia- unclear if superimposed bacterial pneumonia or from RSV. Check procalcitonin. She was given empiric IV Rocephin and Azithromycin in ED

## 2022-05-05 NOTE — ED Notes (Signed)
Assisted pt in use of bedpan;pt cleaned, linens changed

## 2022-05-06 ENCOUNTER — Other Ambulatory Visit (HOSPITAL_COMMUNITY): Payer: Self-pay

## 2022-05-06 DIAGNOSIS — I4891 Unspecified atrial fibrillation: Secondary | ICD-10-CM | POA: Diagnosis not present

## 2022-05-06 LAB — COMPREHENSIVE METABOLIC PANEL
ALT: 22 U/L (ref 0–44)
AST: 36 U/L (ref 15–41)
Albumin: 2.7 g/dL — ABNORMAL LOW (ref 3.5–5.0)
Alkaline Phosphatase: 72 U/L (ref 38–126)
Anion gap: 9 (ref 5–15)
BUN: 16 mg/dL (ref 8–23)
CO2: 28 mmol/L (ref 22–32)
Calcium: 8.5 mg/dL — ABNORMAL LOW (ref 8.9–10.3)
Chloride: 103 mmol/L (ref 98–111)
Creatinine, Ser: 0.79 mg/dL (ref 0.44–1.00)
GFR, Estimated: >60 mL/min (ref >60)
Glucose, Bld: 88 mg/dL (ref 70–99)
Potassium: 3.2 mmol/L — ABNORMAL LOW (ref 3.5–5.1)
Sodium: 140 mmol/L (ref 135–145)
Total Bilirubin: 0.8 mg/dL (ref 0.3–1.2)
Total Protein: 5.4 g/dL — ABNORMAL LOW (ref 6.5–8.1)

## 2022-05-06 LAB — CBC WITH DIFFERENTIAL/PLATELET
Abs Immature Granulocytes: 0.01 10*3/uL (ref 0.00–0.07)
Basophils Absolute: 0.1 10*3/uL (ref 0.0–0.1)
Basophils Relative: 1 %
Eosinophils Absolute: 0 10*3/uL (ref 0.0–0.5)
Eosinophils Relative: 1 %
HCT: 34.9 % — ABNORMAL LOW (ref 36.0–46.0)
Hemoglobin: 12.1 g/dL (ref 12.0–15.0)
Immature Granulocytes: 0 %
Lymphocytes Relative: 35 %
Lymphs Abs: 2.3 10*3/uL (ref 0.7–4.0)
MCH: 33.3 pg (ref 26.0–34.0)
MCHC: 34.7 g/dL (ref 30.0–36.0)
MCV: 96.1 fL (ref 80.0–100.0)
Monocytes Absolute: 1.6 10*3/uL — ABNORMAL HIGH (ref 0.1–1.0)
Monocytes Relative: 24 %
Neutro Abs: 2.6 10*3/uL (ref 1.7–7.7)
Neutrophils Relative %: 39 %
Platelets: 148 10*3/uL — ABNORMAL LOW (ref 150–400)
RBC: 3.63 MIL/uL — ABNORMAL LOW (ref 3.87–5.11)
RDW: 14.4 % (ref 11.5–15.5)
WBC: 6.6 10*3/uL (ref 4.0–10.5)
nRBC: 0 % (ref 0.0–0.2)

## 2022-05-06 LAB — PHOSPHORUS: Phosphorus: 3.3 mg/dL (ref 2.5–4.6)

## 2022-05-06 LAB — MAGNESIUM: Magnesium: 1.7 mg/dL (ref 1.7–2.4)

## 2022-05-06 LAB — MRSA NEXT GEN BY PCR, NASAL: MRSA by PCR Next Gen: NOT DETECTED

## 2022-05-06 MED ORDER — ROSUVASTATIN CALCIUM 5 MG PO TABS
5.0000 mg | ORAL_TABLET | Freq: Every day | ORAL | Status: DC
  Administered 2022-05-06: 5 mg via ORAL
  Filled 2022-05-06: qty 1

## 2022-05-06 MED ORDER — RIVAROXABAN 10 MG PO TABS
10.0000 mg | ORAL_TABLET | Freq: Every day | ORAL | Status: DC
  Administered 2022-05-07: 10 mg via ORAL
  Filled 2022-05-06: qty 1

## 2022-05-06 MED ORDER — POTASSIUM CHLORIDE CRYS ER 20 MEQ PO TBCR
40.0000 meq | EXTENDED_RELEASE_TABLET | ORAL | Status: DC
  Filled 2022-05-06: qty 2

## 2022-05-06 MED ORDER — METOPROLOL TARTRATE 25 MG PO TABS
25.0000 mg | ORAL_TABLET | Freq: Two times a day (BID) | ORAL | Status: DC
  Administered 2022-05-06 – 2022-05-07 (×3): 25 mg via ORAL
  Filled 2022-05-06 (×3): qty 1

## 2022-05-06 MED ORDER — LEVOTHYROXINE SODIUM 25 MCG PO TABS
25.0000 ug | ORAL_TABLET | Freq: Every day | ORAL | Status: DC
  Administered 2022-05-06 – 2022-05-07 (×2): 25 ug via ORAL
  Filled 2022-05-06 (×2): qty 1

## 2022-05-06 MED ORDER — POTASSIUM CHLORIDE 20 MEQ PO PACK
40.0000 meq | PACK | ORAL | Status: AC
  Administered 2022-05-06 (×2): 40 meq via ORAL
  Filled 2022-05-06 (×2): qty 2

## 2022-05-06 MED ORDER — SERTRALINE HCL 25 MG PO TABS
25.0000 mg | ORAL_TABLET | Freq: Every day | ORAL | Status: DC
  Administered 2022-05-06 – 2022-05-07 (×2): 25 mg via ORAL
  Filled 2022-05-06 (×2): qty 1

## 2022-05-06 MED ORDER — ENSURE ENLIVE PO LIQD
237.0000 mL | Freq: Two times a day (BID) | ORAL | Status: DC
  Administered 2022-05-06 – 2022-05-07 (×2): 237 mL via ORAL

## 2022-05-06 MED ORDER — MAGNESIUM SULFATE IN D5W 1-5 GM/100ML-% IV SOLN
1.0000 g | Freq: Once | INTRAVENOUS | Status: AC
  Administered 2022-05-06: 1 g via INTRAVENOUS
  Filled 2022-05-06: qty 100

## 2022-05-06 NOTE — NC FL2 (Signed)
[place] MEDICAID FL2 LEVEL OF CARE FORM     IDENTIFICATION  Patient Name: Lynn Knox Birthdate: 16-Jun-1924 Sex: female Admission Date (Current Location): 05/04/2022  [facility] and [place] Number:  Producer, television/film/video and Address:  [facility], [address] [place], [place] [address]      Provider Number: [id number]  Attending Physician Name and Address:  Zigmund Daniel., *  Relative Name and Phone Number:  Jeannett Senior (grandson) [phone]    Current Level of Care: Hospital Recommended Level of Care: Skilled Nursing Facility Prior Approval Number:    Date Approved/Denied:   PASRR Number: [id number]  Discharge Plan: SNF    Current Diagnoses: Patient Active Problem List   Diagnosis Date Noted   Hypokalemia 05/05/2022   Lactic acidosis 05/05/2022   Dementia with behavioral disturbance (HCC) 05/05/2022   Acute hypoxic respiratory failure (HCC) 05/05/2022   Atrial fibrillation with RVR (HCC) 05/04/2022   Hypothyroidism 05/04/2022   RSV (respiratory syncytial virus pneumonia) 05/04/2022   Displaced fracture of greater trochanter of left femur, initial encounter for closed fracture (HCC) 11/10/2020   Hypertension 11/10/2020   Atrial fibrillation (HCC) 06/23/2020   Noise-induced hearing loss of both ears 06/23/2020   Prediabetes 06/23/2020   Urinary incontinence 06/23/2020   Asthma 05/28/2019    Orientation RESPIRATION BLADDER Height & Weight     Self, Situation  O2 (3 liters) Incontinent, External catheter Weight: 140 lb (63.5 kg) (estimate) Height:  5\' 1"  (154.9 cm)  BEHAVIORAL SYMPTOMS/MOOD NEUROLOGICAL BOWEL NUTRITION STATUS      Continent Diet (See dc summary)  AMBULATORY STATUS COMMUNICATION OF NEEDS Skin   Limited Assist Verbally Normal                       Personal Care Assistance Level of Assistance  Bathing, Feeding, Dressing Bathing Assistance: Limited assistance Feeding assistance: Independent Dressing  Assistance: Limited assistance     Functional Limitations Info  Sight, Hearing, Speech Sight Info: Adequate Hearing Info: Impaired Speech Info: Adequate    SPECIAL CARE FACTORS FREQUENCY  PT (By licensed PT), OT (By licensed OT)     PT Frequency: 5xweek OT Frequency: 5xweek            Contractures      Additional Factors Info  Code Status, Allergies Code Status Info: DNR Allergies Info: Doxycycline           Current Medications (05/06/2022):  This is the current hospital active medication list Current Facility-Administered Medications  Medication Dose Route Frequency Provider Last Rate Last Admin   ipratropium-albuterol (DUONEB) 0.5-2.5 (3) MG/3ML nebulizer solution 3 mL  3 mL Nebulization Q6H WA 05/08/2022., MD   3 mL at 05/06/22 0856   levalbuterol (XOPENEX) nebulizer solution 0.63 mg  0.63 mg Nebulization Q4H PRN Tu, Ching T, DO   0.63 mg at 05/05/22 2004   levothyroxine (SYNTHROID) tablet 25 mcg  25 mcg Oral Q0600 2005., MD   25 mcg at 05/06/22 05/08/22   metoprolol tartrate (LOPRESSOR) tablet 25 mg  25 mg Oral BID 2446., MD   25 mg at 05/06/22 0850   [START ON 05/07/2022] rivaroxaban (XARELTO) tablet 10 mg  10 mg Oral Daily 05/09/2022., MD       rosuvastatin (CRESTOR) tablet 5 mg  5 mg Oral QHS Zigmund Daniel., MD       sertraline (ZOLOFT) tablet 25 mg  25  mg Oral Daily Zigmund Daniel., MD   25 mg at 05/06/22 0867     Discharge Medications: Please see discharge summary for a list of discharge medications.  Relevant Imaging Results:  Relevant Lab Results:   Additional Information SSN: [id number]  Lynn Knox, MSW, LCSWA, LCASA Transitions of Care  Clinical Social Worker I

## 2022-05-06 NOTE — Discharge Instructions (Addendum)

## 2022-05-06 NOTE — Progress Notes (Addendum)
PROGRESS NOTE    Lynn Knox  GYJ:[id number] DOB: Apr 09, 1925 DOA: 05/04/2022 PCP: Medicine, [facility]  Chief Complaint  Patient presents with   Cough        Shortness of Breath    Brief Narrative:   Lynn Knox is Lynn Knox 88 y.o. female with medical history significant of dementia, atrial fibrillation, COPD, HTN, hypothyroidism who presents with increase shortness of breath and cough.   Assessment & Plan:   Principal Problem:   Atrial fibrillation with RVR (HCC) Active Problems:   Hypertension   Hypothyroidism   RSV (respiratory syncytial virus pneumonia)   Hypokalemia   Lactic acidosis   Dementia with behavioral disturbance (HCC)   Acute hypoxic respiratory failure (HCC)  Atrial fibrillation with RVR (HCC) -uncontrolled rates overnight -TSH within normal limits -likely exacerbated by RSV -seems like she was on xarelto at facility.  Have transitioned her to eliquis here.  Will discuss additionally with grandson (addendum, he prefers she remain on xarelto, will switch back). -uptitrate metoprolol as tolerated -repeat echo with EF 65-70%, no RWMA, mildly reduced RVSF, mildly elevated PASP   Acute hypoxic respiratory failure (HCC)  RSV infection -currently on RA -RSV + -CXR with patchy L mid lung airspace opacity -negative procalcitonin, normal WBC count, afebrile -received abx in ED, but suspect this represents viral pneumonia related to RSV - hold further abx and follow    Dementia with behavioral disturbance ([place]) -suspect she's near her baseline today -hard of hearing as well and with visual impairment as well, complicating this -delirium precautions -will try to limit antipsychotics/benzos as possible    Lactic acidosis -resolved   Hypokalemia -replace and follow   RSV (respiratory syncytial virus pneumonia) -continue symptomatic management -duonebs, prn xopenex   Hypothyroidism -continue levothyroxine, TSH wnl  Right Lower Extremity  Edema - neg for dvt  Hard of Hearing  Visual Impairment - noted    DVT prophylaxis: heparin gtt on hold overnight due to elevated INR Code Status: DNR Family Communication: called grandson 12/28 Disposition:   Status is: Inpatient Remains inpatient appropriate because: continued RVR   Consultants:  none  Procedures:  none  Antimicrobials:  Anti-infectives (From admission, onward)    Start     Dose/Rate Route Frequency Ordered Stop   05/04/22 1945  cefTRIAXone (ROCEPHIN) 1 g in sodium chloride 0.9 % 100 mL IVPB        1 g 200 mL/hr over 30 Minutes Intravenous  Once 05/04/22 1937 05/04/22 2105   05/04/22 1945  azithromycin (ZITHROMAX) 500 mg in sodium chloride 0.9 % 250 mL IVPB        500 mg 250 mL/hr over 60 Minutes Intravenous  Once 05/04/22 1937 05/04/22 2241       Subjective: No reported complaints Hard of hearing, limits interview  Objective: Vitals:   05/05/22 1942 05/05/22 2217 05/05/22 2325 05/06/22 0414  BP: 128/84 104/87 (!) 142/75 (!) 110/56  Pulse: (!) 120 (!) 109 94 92  Resp: (!) 24 (!) _0 Temp: (!) 97.3 F (36.3 C) (!) 97.5 F (36.4 C) (!) 96.8 F (36 C) 99.1 F (37.3 C)  TempSrc: Oral Oral Oral Oral  SpO2: 91% 96% 98% 94%  Weight:      Height:        Intake/Output Summary (Last 24 hours) at 05/06/2022 0853 Last data filed at 05/06/2022 0439 Gross per 24 hour  Intake 480 ml  Output 300 ml  Net 180 ml   [employer]  05/04/22 2300  Weight: 63.5 kg    Examination:  General: No acute distress. Cardiovascular: irregularly irregular, tachy (low 100's) Lungs: unlabored on 3 L [place], coarse breath sounds at bilateral bases Abdomen: Soft, nontender, nondistended Neurological: hard of hearing. Moves all extremities 4. Cranial nerves II through XII grossly intact. Extremities: No clubbing or cyanosis. No edema.  Data Reviewed: I have personally reviewed following labs and imaging studies  CBC: Recent Labs  Lab 05/04/22 1900  05/05/22 0520 05/06/22 0016  WBC 6.0 5.1 6.6  NEUTROABS 2.7  --  2.6  HGB 14.8 13.0 12.1  HCT 44.4 40.8 34.9*  MCV 99.1 100.2* 96.1  PLT 188 166 148*    Basic Metabolic Panel: Recent Labs  Lab 05/04/22 1900 05/06/22 0016  NA 144 140  K 3.2* 3.2*  CL 103 103  CO2 30 28  GLUCOSE 125* 88  BUN 14 16  CREATININE 0.87 0.79  CALCIUM 9.1 8.5*  MG  --  1.7  PHOS  --  3.3    GFR: Estimated Creatinine Clearance: 35.9 mL/min (by C-G formula based on SCr of 0.79 mg/dL).  Liver Function Tests: Recent Labs  Lab 05/04/22 1900 05/06/22 0016  AST 39 36  ALT 23 22  ALKPHOS 87 72  BILITOT 0.7 0.8  PROT 7.2 5.4*  ALBUMIN 3.5 2.7*    CBG: No results for input(s): "GLUCAP" in the last 168 hours.   Recent Results (from the past 240 hour(s))  Blood Culture (routine x 2)     Status: None (Preliminary result)   Collection Time: 05/04/22  7:00 PM   Specimen: BLOOD RIGHT ARM  Result Value Ref Range Status   Specimen Description BLOOD RIGHT ARM  Final   Special Requests   Final    BOTTLES DRAWN AEROBIC AND ANAEROBIC Blood Culture results may not be optimal due to an excessive volume of blood received in culture bottles   Culture   Final    NO GROWTH 2 DAYS Performed at [facility], [place] [address] [place], [place] [address]    Report Status PENDING  Incomplete  Urine Culture     Status: Abnormal   Collection Time: 05/04/22  7:00 PM   Specimen: In/Out Cath Urine  Result Value Ref Range Status   Specimen Description IN/OUT CATH URINE  Final   Special Requests   Final    NONE Performed at [facility], [place] [address] [place], [place] [address]    Culture MULTIPLE SPECIES PRESENT, SUGGEST RECOLLECTION (Elysha Daw)  Final   Report Status 05/05/2022 FINAL  Final  Resp panel by RT-PCR (RSV, Flu Luian Schumpert&B, Covid) Anterior Nasal Swab     Status: Abnormal   Collection Time: 05/04/22  7:06 PM   Specimen: Anterior Nasal Swab  Result Value Ref Range Status   SARS Coronavirus 2 by RT  PCR NEGATIVE NEGATIVE Final    Comment: (NOTE) SARS-CoV-2 target nucleic acids are NOT DETECTED.  The SARS-CoV-2 RNA is generally detectable in upper respiratory specimens during the acute phase of infection. The lowest concentration of SARS-CoV-2 viral copies this assay can detect is 138 copies/mL. Jaydrien Wassenaar negative result does not preclude SARS-Cov-2 infection and should not be used as the sole basis for treatment or other patient management decisions. Connery Shiffler negative result may occur with  improper specimen collection/handling, submission of specimen other than nasopharyngeal swab, presence of viral mutation(s) within the areas targeted by this assay, and inadequate number of viral copies(<138 copies/mL). Hilde Churchman negative result must be  combined with clinical observations, patient history, and epidemiological information. The expected result is Negative.  Fact Sheet for Patients:  EntrepreneurPulse.com.au  Fact Sheet for Healthcare Providers:  IncredibleEmployment.be  This test is no t yet approved or cleared by the [place] FDA and  has been authorized for detection and/or diagnosis of SARS-CoV-2 by FDA under an Emergency Use Authorization (EUA). This EUA will remain  in effect (meaning this test can be used) for the duration of the COVID-19 declaration under Section 564(b)(1) of the Act, 21 U.S.C.section 360bbb-3(b)(1), unless the authorization is terminated  or revoked sooner.       Influenza Jonasia Coiner by PCR NEGATIVE NEGATIVE Final   Influenza B by PCR NEGATIVE NEGATIVE Final    Comment: (NOTE) The Xpert Xpress SARS-CoV-2/FLU/RSV plus assay is intended as an aid in the diagnosis of influenza from Nasopharyngeal swab specimens and should not be used as Yuliet Needs sole basis for treatment. Nasal washings and aspirates are unacceptable for Xpert Xpress SARS-CoV-2/FLU/RSV testing.  Fact Sheet for Patients: EntrepreneurPulse.com.au  Fact Sheet for  Healthcare Providers: IncredibleEmployment.be  This test is not yet approved or cleared by the [place] FDA and has been authorized for detection and/or diagnosis of SARS-CoV-2 by FDA under an Emergency Use Authorization (EUA). This EUA will remain in effect (meaning this test can be used) for the duration of the COVID-19 declaration under Section 564(b)(1) of the Act, 21 U.S.C. section 360bbb-3(b)(1), unless the authorization is terminated or revoked.     Resp Syncytial Virus by PCR POSITIVE (Darcey Cardy) NEGATIVE Final    Comment: (NOTE) Fact Sheet for Patients: EntrepreneurPulse.com.au  Fact Sheet for Healthcare Providers: IncredibleEmployment.be  This test is not yet approved or cleared by the [place] FDA and has been authorized for detection and/or diagnosis of SARS-CoV-2 by FDA under an Emergency Use Authorization (EUA). This EUA will remain in effect (meaning this test can be used) for the duration of the COVID-19 declaration under Section 564(b)(1) of the Act, 21 U.S.C. section 360bbb-3(b)(1), unless the authorization is terminated or revoked.  Performed at [facility], [place] [address], [place], [place] [address]   Blood Culture (routine x 2)     Status: None (Preliminary result)   Collection Time: 05/04/22  7:19 PM   Specimen: BLOOD  Result Value Ref Range Status   Specimen Description BLOOD LEFT ANTECUBITAL  Final   Special Requests   Final    BOTTLES DRAWN AEROBIC AND ANAEROBIC Blood Culture results may not be optimal due to an excessive volume of blood received in culture bottles   Culture   Final    NO GROWTH 2 DAYS Performed at [facility], [place] [address], [place], [place] [address]    Report Status PENDING  Incomplete  MRSA Next Gen by PCR, Nasal     Status: None   Collection Time: 05/05/22 10:39 PM   Specimen: Nasal Mucosa; Nasal Swab  Result Value Ref Range Status   MRSA by PCR Next Gen  NOT DETECTED NOT DETECTED Final    Comment: (NOTE) The GeneXpert MRSA Assay (FDA approved for NASAL specimens only), is one component of Shontel Santee comprehensive MRSA colonization surveillance program. It is not intended to diagnose MRSA infection nor to guide or monitor treatment for MRSA infections. Test performance is not FDA approved in patients less than 63 years old. Performed at [facility], [place] [address], [place], [place] [address]          Radiology Studies: VAS [place]  LOWER EXTREMITY VENOUS (DVT)  Result Date: 05/05/2022  Lower Venous DVT Study Patient Name:  Lynn Knox  Date of Exam:   05/05/2022 Medical Rec #: [id number]   Accession #:    [id number] Date of Birth: 1924/07/01   Patient Gender: F Patient Age:   25 years Exam Location:  [facility] Procedure:      VAS [place] LOWER EXTREMITY VENOUS (DVT) Referring Phys: Tarika Mckethan POWELL JR --------------------------------------------------------------------------------  Indications: Edema.  Risk Factors: None identified. Limitations: Poor ultrasound/tissue interface and patient positioning, patient movement. Comparison Study: No prior studies. Performing Technologist: Oliver Hum RVT  Examination Guidelines: Shaneika Rossa complete evaluation includes B-mode imaging, spectral Doppler, color Doppler, and power Doppler as needed of all accessible portions of each vessel. Bilateral testing is considered an integral part of Crayton Savarese complete examination. Limited examinations for reoccurring indications may be performed as noted. The reflux portion of the exam is performed with the patient in reverse Trendelenburg.  +---------+---------------+---------+-----------+----------+--------------+ RIGHT    CompressibilityPhasicitySpontaneityPropertiesThrombus Aging +---------+---------------+---------+-----------+----------+--------------+ CFV      Full           Yes      Yes                                  +---------+---------------+---------+-----------+----------+--------------+ SFJ      Full                                                        +---------+---------------+---------+-----------+----------+--------------+ FV Prox  Full                                                        +---------+---------------+---------+-----------+----------+--------------+ FV Mid   Full                                                        +---------+---------------+---------+-----------+----------+--------------+ FV DistalFull                                                        +---------+---------------+---------+-----------+----------+--------------+ PFV      Full                                                        +---------+---------------+---------+-----------+----------+--------------+ POP      Full           Yes      Yes                                 +---------+---------------+---------+-----------+----------+--------------+ PTV      Full                                                        +---------+---------------+---------+-----------+----------+--------------+  PERO     Full                                                        +---------+---------------+---------+-----------+----------+--------------+   +----+---------------+---------+-----------+----------+--------------+ LEFTCompressibilityPhasicitySpontaneityPropertiesThrombus Aging +----+---------------+---------+-----------+----------+--------------+ CFV Full           Yes      Yes                                 +----+---------------+---------+-----------+----------+--------------+     Summary: RIGHT: - There is no evidence of deep vein thrombosis in the lower extremity. However, portions of this examination were limited- see technologist comments above.  - No cystic structure found in the popliteal fossa.  LEFT: - No evidence of common femoral vein obstruction.  *See table(s)  above for measurements and observations. Electronically signed by Deitra Mayo MD on 05/05/2022 at 2:29:34 PM.    Final    ECHOCARDIOGRAM COMPLETE  Result Date: 05/05/2022    ECHOCARDIOGRAM REPORT   Patient Name:   [facility] Date of Exam: 05/05/2022 Medical Rec #:  [id number]  Height:       61.0 in Accession #:    [id number] Weight:       140.0 lb Date of Birth:  1925-04-24  BSA:          1.623 m Patient Age:    89 years   BP:           141/93 mmHg Patient Gender: F          HR:           113 bpm. Exam Location:  Inpatient Procedure: 2D Echo, Color Doppler and Cardiac Doppler Indications:    I48.91* Unspecified atrial fibrillation  History:        Patient has prior history of Echocardiogram examinations, most                 recent 11/11/2020. COPD, Arrythmias:Atrial Fibrillation; Risk                 Factors:Hypertension.  Sonographer:    Raquel Sarna Senior RDCS Referring Phys: [phone] Tavarius Grewe CALDWELL POWELL JR  Sonographer Comments: Difficult study due to respiratory interference and thin body habitus. IMPRESSIONS  1. Left ventricular ejection fraction, by estimation, is 65 to 70%. The left ventricle has normal function. The left ventricle has no regional wall motion abnormalities. There is mild asymmetric left ventricular hypertrophy of the basal-septal segment. Diastolic function indeterminant due to severe MAC.  2. Right ventricular systolic function is mildly reduced. The right ventricular size is normal. There is mildly elevated pulmonary artery systolic pressure. The estimated right ventricular systolic pressure is 25.4 mmHg.  3. Left atrial size was mildly dilated.  4. Right atrial size was mildly dilated.  5. The mitral valve is degenerative. Mild mitral valve regurgitation. Mild calcific mitral stenosis. The mean mitral valve gradient is 6.0 mmHg at HR 114. Severe mitral annular calcification.  6. The aortic valve is tricuspid. There is moderate calcification of the aortic valve. There is moderate  thickening of the aortic valve. Aortic valve regurgitation is mild to moderate. Aortic valve sclerosis/calcification is present, without any evidence of aortic stenosis.  7. The inferior vena cava is dilated in size with <50% respiratory variability, suggesting  right atrial pressure of 15 mmHg. Comparison(s): Compared to prior TTE on 11/2020, RAP is now 71mHg (previously 325mg). Otherwise, no significant change. FINDINGS  Left Ventricle: Left ventricular ejection fraction, by estimation, is 65 to 70%. The left ventricle has normal function. The left ventricle has no regional wall motion abnormalities. The left ventricular internal cavity size was small. There is mild asymmetric left ventricular hypertrophy of the basal-septal segment. Diastolic function indeterminant due to severe MAC. Right Ventricle: The right ventricular size is normal. Right vetricular wall thickness was not well visualized. Right ventricular systolic function is mildly reduced. There is mildly elevated pulmonary artery systolic pressure. The tricuspid regurgitant velocity is 2.66 m/s, and with an assumed right atrial pressure of 15 mmHg, the estimated right ventricular systolic pressure is 4307.6mHg. Left Atrium: Left atrial size was mildly dilated. Right Atrium: Right atrial size was mildly dilated. Pericardium: There is no evidence of pericardial effusion. Mitral Valve: The mitral valve is degenerative in appearance. There is moderate thickening of the mitral valve leaflet(s). There is moderate calcification of the mitral valve leaflet(s). Severe mitral annular calcification. Mild mitral valve regurgitation. Mild mitral valve stenosis. MV peak gradient, 11.4 mmHg. The mean mitral valve gradient is 6.0 mmHg. Tricuspid Valve: The tricuspid valve is normal in structure. Tricuspid valve regurgitation is mild. Aortic Valve: The aortic valve is tricuspid. There is moderate calcification of the aortic valve. There is moderate thickening of the  aortic valve. Aortic valve regurgitation is mild to moderate. Aortic valve sclerosis/calcification is present, without any evidence of aortic stenosis. Pulmonic Valve: The pulmonic valve was grossly normal. Pulmonic valve regurgitation is mild. Aorta: The aortic root is normal in size and structure. Venous: The inferior vena cava is dilated in size with less than 50% respiratory variability, suggesting right atrial pressure of 15 mmHg. IAS/Shunts: The atrial septum is grossly normal.  LEFT VENTRICLE PLAX 2D LVIDd:         3.20 cm LVIDs:         2.00 cm LV PW:         0.80 cm LV IVS:        1.00 cm LVOT diam:     1.80 cm LV SV:         35 LV SV Index:   22 LVOT Area:     2.54 cm  RIGHT VENTRICLE RV S prime:     9.03 cm/s TAPSE (M-mode): 1.4 cm LEFT ATRIUM             Index        RIGHT ATRIUM           Index LA diam:        3.70 cm 2.28 cm/m   RA Area:     14.80 cm LA Vol (A2C):   61.0 ml 37.58 ml/m  RA Volume:   31.00 ml  19.10 ml/m LA Vol (A4C):   49.5 ml 30.50 ml/m LA Biplane Vol: 61.1 ml 37.64 ml/m  AORTIC VALVE LVOT Vmax:   85.90 cm/s LVOT Vmean:  57.700 cm/s LVOT VTI:    0.139 m  AORTA Ao Root diam: 3.00 cm MITRAL VALVE              TRICUSPID VALVE MV Area VTI:  1.35 cm    TR Peak grad:   28.3 mmHg MV Peak grad: 11.4 mmHg   TR Vmax:        266.00 cm/s MV Mean grad: 6.0 mmHg MV Vmax:  1.69 m/s    SHUNTS MV Vmean:     110.0 cm/s  Systemic VTI:  0.14 m                           Systemic Diam: 1.80 cm Gwyndolyn Kaufman MD Electronically signed by Gwyndolyn Kaufman MD Signature Date/Time: 05/05/2022/1:17:26 PM    Final    DG Chest Port 1 View  Result Date: 05/04/2022 CLINICAL DATA:  Cough and flu like symptoms EXAM: PORTABLE CHEST 1 VIEW COMPARISON:  11/10/2020 FINDINGS: Cardiac shadow is within normal limits. Aortic calcifications and mitral annular patchy airspace opacity is noted in the left mid lung likely representing early infiltrate. This is somewhat obscured by overlying leads. No bony  abnormality is noted. IMPRESSION: Patchy left mid lung airspace opacity. Electronically Signed   By: Inez Catalina M.D.   On: 05/04/2022 19:33        Scheduled Meds:  apixaban  5 mg Oral BID   ipratropium-albuterol  3 mL Nebulization Q6H WA   metoprolol tartrate  25 mg Oral BID   potassium chloride  40 mEq Oral Q4H   Continuous Infusions:  magnesium sulfate bolus IVPB 1 g (05/06/22 0850)     LOS: 2 days    Time spent: over 30 min    Fayrene Helper, MD [facility]   To contact the attending provider between 7A-7P or the covering provider during after hours 7P-7A, please log into the web site [email or web address] and access using universal [place] password for that web site. If you do not have the password, please call the hospital operator.  05/06/2022, 8:53 AM

## 2022-05-06 NOTE — TOC Initial Note (Signed)
Transition of Care [facility]) - Initial/Assessment Note    Patient Details  Name: Lynn Knox MRN: [id number] Date of Birth: [birth year]  [facility] of [facility]) CM/SW Contact:    Erin Sons, LCSW Phone Number: 05/06/2022, 1:41 PM  Clinical Narrative:                  CSW contacted [facility] intake and confirmed pt is LTC there. She can readmit when medically ready. CSW notified [facility] intake of anticipated DC tomorrow.   Expected Discharge Plan: Skilled Nursing Facility Barriers to Discharge: Continued Medical Work up   Patient Goals and CMS Choice            Expected Discharge Plan and Services       Living arrangements for the past 2 months: Skilled Nursing Facility                                      Prior Living Arrangements/Services Living arrangements for the past 2 months: Skilled Nursing Facility Lives with:: Facility Resident                   Activities of Daily Living      Permission Sought/Granted                  Emotional Assessment         Alcohol / Substance Use: Not Applicable Psych Involvement: No (comment)  Admission diagnosis:  RSV (acute bronchiolitis due to respiratory syncytial virus) [J21.0] Atrial fibrillation with RVR (HCC) [I48.91] Community acquired pneumonia, unspecified laterality [J18.9] Sepsis, due to unspecified organism, unspecified whether acute organ dysfunction present [facility]) [A41.9] Patient Active Problem List   Diagnosis Date Noted   Hypokalemia 05/05/2022   Lactic acidosis 05/05/2022   Dementia with behavioral disturbance (HCC) 05/05/2022   Acute hypoxic respiratory failure (HCC) 05/05/2022   Atrial fibrillation with RVR (HCC) 05/04/2022   Hypothyroidism 05/04/2022   RSV (respiratory syncytial virus pneumonia) 05/04/2022   Displaced fracture of greater trochanter of left femur, initial encounter for closed fracture (HCC) 11/10/2020   Hypertension 11/10/2020   Atrial fibrillation  (HCC) 06/23/2020   Noise-induced hearing loss of both ears 06/23/2020   Prediabetes 06/23/2020   Urinary incontinence 06/23/2020   Asthma 05/28/2019   PCP:  Medicine, [facility]:   [id number] - [place], [place] - [address] [place] [address] Phone: [phone] Fax: [phone]  [email or web address] - [place], [place] - [address] [place] [address] Phone: [phone] Fax: [phone]     Social Determinants of Health (SDOH) Social History: SDOH Screenings   Tobacco Use: Low Risk  (05/04/2022)   SDOH Interventions:     Readmission Risk Interventions     No data to display

## 2022-05-06 NOTE — Evaluation (Signed)
Physical Therapy Evaluation Patient Details Name: Lynn Knox MRN: [id number] DOB: Oct 07, 1924 Today's Date: 05/06/2022  History of Present Illness  Pt is a 88 y.o. female nursing home resident admitted 05/04/22 with SOB, cough. Workup for afib with RVR, (+) RSV. PMH includes demential afib, COPD, HTN.   Clinical Impression  Pt presents with an overall decrease in functional mobility secondary to above. Per chart, pt resident at nursing home; pt pleasantly confused, oriented to self and being in a hospital; pt reports she is ambulatory with RW. Today, pt able to perform brief bout of standing activity with min-modA for stability, limited by SOB and fatigue. Difficulty getting reliable SpO2 reading on RA with activity; resting SpO2 97% on 2L O2 [place]. Pt would benefit from continued acute PT services to maximize functional mobility and independence prior to d/c back to SNF.      Recommendations for follow up therapy are one component of a multi-disciplinary discharge planning process, led by the attending physician.  Recommendations may be updated based on patient status, additional functional criteria and insurance authorization.  Follow Up Recommendations Skilled nursing-short term rehab (<3 hours/day) (return to SNF) Can patient physically be transported by private vehicle: Yes    Assistance Recommended at Discharge Frequent or constant Supervision/Assistance  Patient can return home with the following  A little help with walking and/or transfers;A little help with bathing/dressing/bathroom;Assistance with cooking/housework;Direct supervision/assist for medications management;Direct supervision/assist for financial management;Assist for transportation    Equipment Recommendations None recommended by PT  Recommendations for Other Services   Mobility Specialist   Functional Status Assessment Patient has had a recent decline in their functional status and demonstrates the ability to make  significant improvements in function in a reasonable and predictable amount of time.     Precautions / Restrictions Precautions Precautions: Fall;Other (comment) Precaution Comments: watch SpO2 (difficulty getting reliable reading) Restrictions Weight Bearing Restrictions: No      Mobility  Bed Mobility Overal bed mobility: Needs Assistance Bed Mobility: Supine to Sit     Supine to sit: Supervision          Transfers Overall transfer level: Needs assistance Equipment used: 1 person hand held assist Transfers: Sit to/from Stand, Bed to chair/wheelchair/BSC Sit to Stand: Min assist   Step pivot transfers: Mod assist       General transfer comment: minA for stability upon standing, pt reaching for HHA; pivotal steps from bed to recliner with modA for HHA to stabilize; notable SOB requiring seated rest    Ambulation/Gait               General Gait Details: deferred - notable SOB with steps to recliner, pt falling asleep  Stairs            Wheelchair Mobility    Modified Rankin (Stroke Patients Only)       Balance Overall balance assessment: Needs assistance Sitting-balance support: No upper extremity supported, Feet supported Sitting balance-Leahy Scale: Fair Sitting balance - Comments: able to pick up shoes from floor to don while sitting EOB   Standing balance support: Single extremity supported, No upper extremity supported, During functional activity Standing balance-Leahy Scale: Poor Standing balance comment: UE support for static standing balance                             Pertinent Vitals/Pain Pain Assessment Pain Assessment: No/denies pain Pain Intervention(s): Monitored during session    Home Living Family/patient expects to  be discharged to:: Skilled nursing facility                   Additional Comments: per chart, resident at nursing home. grandson Brett Canales) involved    Prior Function Prior Level of Function :  Needs assist;Patient poor historian/Family not available             Mobility Comments: pt reports ambulating with RW; suspect supervision/assist from staff as needed       Hand Dominance        Extremity/Trunk Assessment   Upper Extremity Assessment Upper Extremity Assessment: Generalized weakness    Lower Extremity Assessment Lower Extremity Assessment: Generalized weakness    Cervical / Trunk Assessment Cervical / Trunk Assessment: Kyphotic  Communication   Communication: HOH  Cognition Arousal/Alertness: Awake/alert Behavior During Therapy: WFL for tasks assessed/performed Overall Cognitive Status: History of cognitive impairments - at baseline                                 General Comments: h/o dementia; pt pleasantly confused, oriented to self and "hospital", aware she does not "keep track of the day". states she "doesn't have a home" but unable to state nursing home resident. pt following simple verbal/gestural commands with cues. apparent cognitive deficits exacerbated by very HOH. pt falling asleep quickly once sitting in recliner        General Comments General comments (skin integrity, edema, etc.): session perform on RA with DOE 3/4, unable to get reliable pulse ox reading; replaced 2L O2 [place] at end of session with SpO2 97%    Exercises     Assessment/Plan    PT Assessment Patient needs continued PT services  PT Problem List Decreased strength;Decreased activity tolerance;Decreased balance;Decreased mobility;Decreased cognition;Cardiopulmonary status limiting activity       PT Treatment Interventions DME instruction;Gait training;Functional mobility training;Therapeutic activities;Therapeutic exercise;Balance training;Patient/family education    PT Goals (Current goals can be found in the Care Plan section)  Acute Rehab PT Goals Patient Stated Goal: reports happy to be out of bed PT Goal Formulation: With patient Time For Goal  Achievement: 05/20/22 Potential to Achieve Goals: Good    Frequency Min 3X/week     Co-evaluation               AM-PAC PT "6 Clicks" Mobility  Outcome Measure Help needed turning from your back to your side while in a flat bed without using bedrails?: A Little Help needed moving from lying on your back to sitting on the side of a flat bed without using bedrails?: A Little Help needed moving to and from a bed to a chair (including a wheelchair)?: A Lot Help needed standing up from a chair using your arms (e.g., wheelchair or bedside chair)?: A Little Help needed to walk in hospital room?: Total Help needed climbing 3-5 steps with a railing? : Total 6 Click Score: 13    End of Session Equipment Utilized During Treatment: Oxygen Activity Tolerance: Patient tolerated treatment well;Patient limited by fatigue Patient left: in chair;with call bell/phone within reach;with chair alarm set Nurse Communication: Mobility status PT Visit Diagnosis: Other abnormalities of gait and mobility (R26.89);Muscle weakness (generalized) (M62.81)    Time: 7782-4235 PT Time Calculation (min) (ACUTE ONLY): 21 min   Charges:   PT Evaluation $PT Eval Moderate Complexity: 1 Mod        Ina Homes, PT, DPT Acute Rehabilitation Services  Personal: Secure Chat  Rehab Office: [place] 05/06/2022, 11:49 AM

## 2022-05-06 NOTE — Progress Notes (Signed)
CSW called pt's grandson to notify him of pt dc tomorrow and her return back to [facility]. CSW completed fl2 and faxed out to [facility].   Oletta Lamas, MSW, LCSWA, LCASA Transitions of Care  Clinical Social Worker I

## 2022-05-06 NOTE — Evaluation (Addendum)
Occupational Therapy Evaluation Patient Details Name: Lynn Knox MRN: [id number] DOB: Dec 24, 1924 Today's Date: 05/06/2022   History of Present Illness Pt is a 88 y.o. female nursing home resident admitted 05/04/22 with SOB, cough. Workup for afib with RVR, (+) RSV. PMH includes demential afib, COPD, HTN.   Clinical Impression   Pt reports needing assist at baseline for ADLs, reports use of RW and w/c for mobility, SNF resident. Pt needing min-mod A for ADLs, and min A for transfers with RW, increased directional/safety cues needed due to pt HOH. Pt able to walk short distance in room with min A, SpO2 dropping to 87% on 2L O2. Pt presenting with impairments listed below, will follow acutely. Recommend SNF at d/c.     Recommendations for follow up therapy are one component of a multi-disciplinary discharge planning process, led by the attending physician.  Recommendations may be updated based on patient status, additional functional criteria and insurance authorization.   Follow Up Recommendations  Skilled nursing-short term rehab (<3 hours/day)     Assistance Recommended at Discharge Frequent or constant Supervision/Assistance  Patient can return home with the following A little help with walking and/or transfers;A lot of help with bathing/dressing/bathroom;Assistance with cooking/housework;Direct supervision/assist for medications management;Direct supervision/assist for financial management;Help with stairs or ramp for entrance;Assist for transportation    Functional Status Assessment  Patient has had a recent decline in their functional status and demonstrates the ability to make significant improvements in function in a reasonable and predictable amount of time.  Equipment Recommendations  Other (comment) (defer)    Recommendations for Other Services PT consult     Precautions / Restrictions Precautions Precautions: Fall;Other (comment) Precaution Comments: watch SpO2 (difficulty  getting reliable reading) Restrictions Weight Bearing Restrictions: No      Mobility Bed Mobility               General bed mobility comments: OOB in chair upon arrival and departure    Transfers Overall transfer level: Needs assistance Equipment used: Rolling walker (2 wheels) Transfers: Sit to/from Stand, Bed to chair/wheelchair/BSC Sit to Stand: Min assist           General transfer comment: min A with directional cues      Balance Overall balance assessment: Needs assistance Sitting-balance support: No upper extremity supported, Feet supported Sitting balance-Leahy Scale: Fair Sitting balance - Comments: dons L shoe seated in chair   Standing balance support: Single extremity supported, No upper extremity supported, During functional activity Standing balance-Leahy Scale: Poor                             ADL either performed or assessed with clinical judgement   ADL Overall ADL's : Needs assistance/impaired Eating/Feeding: Set up   Grooming: Minimal assistance   Upper Body Bathing: Moderate assistance   Lower Body Bathing: Moderate assistance   Upper Body Dressing : Moderate assistance   Lower Body Dressing: Moderate assistance   Toilet Transfer: Minimal assistance;Ambulation;Rolling walker (2 wheels)           Functional mobility during ADLs: Minimal assistance;Rolling walker (2 wheels)       Vision   Additional Comments: can see close up but not further distances     Perception Perception Perception Tested?: No   Praxis Praxis Praxis tested?: Not tested    Pertinent Vitals/Pain Pain Assessment Pain Assessment: No/denies pain     Hand Dominance Right   Extremity/Trunk Assessment Upper Extremity Assessment  Upper Extremity Assessment: Generalized weakness   Lower Extremity Assessment Lower Extremity Assessment: Generalized weakness   Cervical / Trunk Assessment Cervical / Trunk Assessment: Kyphotic    Communication Communication Communication: HOH   Cognition Arousal/Alertness: Awake/alert Behavior During Therapy: WFL for tasks assessed/performed Overall Cognitive Status: History of cognitive impairments - at baseline                                 General Comments: following commands with increased time; and max verbal/tactile cuing as pt HOH     General Comments  SpO2 down to 87% on 2L O2 during session, increased to mid 90's once seated    Exercises     Shoulder Instructions      Home Living Family/patient expects to be discharged to:: Skilled nursing facility                                 Additional Comments: per chart, resident at nursing home. grandson Brett Canales) involved      Prior Functioning/Environment Prior Level of Function : Needs assist;Patient poor historian/Family not available             Mobility Comments: pt reports ambulating with RW; suspect supervision/assist from staff as needed, reports also using w/c at facility ADLs Comments: assist for ADLs        OT Problem List: Decreased strength;Decreased range of motion;Decreased activity tolerance;Impaired balance (sitting and/or standing);Decreased safety awareness;Decreased cognition      OT Treatment/Interventions: Self-care/ADL training;Therapeutic exercise;Energy conservation;DME and/or AE instruction;Therapeutic activities;Patient/family education;Balance training    OT Goals(Current goals can be found in the care plan section) Acute Rehab OT Goals Patient Stated Goal: none stated OT Goal Formulation: With patient Time For Goal Achievement: 05/20/22 Potential to Achieve Goals: Good  OT Frequency: Min 2X/week    Co-evaluation              AM-PAC OT "6 Clicks" Daily Activity     Outcome Measure Help from another person eating meals?: None Help from another person taking care of personal grooming?: A Little Help from another person toileting, which  includes using toliet, bedpan, or urinal?: A Lot Help from another person bathing (including washing, rinsing, drying)?: A Lot Help from another person to put on and taking off regular upper body clothing?: A Lot Help from another person to put on and taking off regular lower body clothing?: Total 6 Click Score: 14   End of Session Equipment Utilized During Treatment: Gait belt;Rolling walker (2 wheels);Oxygen Nurse Communication: Mobility status  Activity Tolerance: Patient tolerated treatment well Patient left: with call bell/phone within reach;in chair;with chair alarm set  OT Visit Diagnosis: Unsteadiness on feet (R26.81);Other abnormalities of gait and mobility (R26.89);Muscle weakness (generalized) (M62.81)                Time: 3428-7681 OT Time Calculation (min): 26 min Charges:  OT General Charges $OT Visit: 1 Visit OT Evaluation $OT Eval Moderate Complexity: 1 Mod OT Treatments $Self Care/Home Management : 8-22 mins  Carver Fila, OTD, OTR/L SecureChat Preferred Acute Rehab [phone]   Dalphine Handing 05/06/2022, 4:37 PM

## 2022-05-06 NOTE — Progress Notes (Signed)
Initial Nutrition Assessment  DOCUMENTATION CODES:   Not applicable  INTERVENTION:   - Ensure Enlive po BID, each supplement provides 350 kcal and 20 grams of protein  - Feeding assistance ordered  - Changed pt to "not appropriate" for room service so that pt receives automatic meal trays  NUTRITION DIAGNOSIS:   Inadequate oral intake related to decreased appetite as evidenced by meal completion < 50%.  GOAL:   Patient will meet greater than or equal to 90% of their needs  MONITOR:   Supplement acceptance, PO intake, Labs, Weight trends  REASON FOR ASSESSMENT:   Consult Assessment of nutrition requirement/status, Poor PO  ASSESSMENT:   88 year old female who presented to the ED from SNF on 12/26 with cough and SOB. PMH of atrial fibrillation, dementia, HLD, HTN, hypothyroidism, COPD. Pt admitted with RSV.  Pt working with therapy at time of RD visit. Unable to obtain diet and weight history at this time. Reviewed available weight history in chart. Pt with a 10.9 kg weight loss since 11/10/20. This is a 14.7% weight loss which is not clinically significant for timeframe but is concerning. Unsure whether weight loss may have occurred more acutely. Pt at risk for malnutrition.  RD will change ordering status to "not appropriate" for room service so that automatic meal trays are sent and pt does not miss a meal. RD will also order feeding assistance as pt is HOH with visual impairment. Will order oral nutrition supplements to aid pt in meeting kcal and protein needs.  Pt with non-pitting edema to BLE per nursing documentation.  Meal Completion: 25% x 1 documented meal  Medications reviewed and include: klor-con 40 mEq x 2, IV magnesium sulfate 1 grams x 1  Labs reviewed: potassium 3.2, magnesium 1.7  NUTRITION - FOCUSED PHYSICAL EXAM:  Unable to complete at this time. Will re-attempt at follow-up.  Diet Order:   Diet Order             Diet regular Room service  appropriate? No; Fluid consistency: Thin  Diet effective now                   EDUCATION NEEDS:   Not appropriate for education at this time  Skin:  Skin Assessment: Skin Integrity Issues: Other: non-pressure wound bilateral buttocks  Last BM:  05/03/22  Height:   Ht Readings from Last 1 Encounters:  05/04/22 5\' 1"  (1.549 m)    Weight:   Wt Readings from Last 1 Encounters:  05/04/22 63.5 kg    BMI:  Body mass index is 26.45 kg/m.  Estimated Nutritional Needs:   Kcal:  1450-1650  Protein:  70-80 grams  Fluid:  1.4-1.6 L    05/06/22, MS, RD, LDN Inpatient Clinical Dietitian Please see AMiON for contact information.

## 2022-05-06 NOTE — TOC Benefit Eligibility Note (Addendum)
Patient Product/process development scientist completed.    The patient is currently admitted and upon discharge could be taking Eliquis 5 mg.  The current 30 day co-pay is $0.00.   The patient is currently admitted and upon discharge could be taking Xarelto 10 mg.  The current 30 day co-pay is $0.00.   The patient is insured through [employer] Part D   Roland Earl, CPHT Pharmacy Patient Advocate Specialist [facility] Patient Advocate Team Direct Number: [phone]  Fax: [phone]

## 2022-05-07 DIAGNOSIS — I4891 Unspecified atrial fibrillation: Secondary | ICD-10-CM | POA: Diagnosis not present

## 2022-05-07 LAB — CBC WITH DIFFERENTIAL/PLATELET
Abs Immature Granulocytes: 0 10*3/uL (ref 0.00–0.07)
Basophils Absolute: 0.1 10*3/uL (ref 0.0–0.1)
Basophils Relative: 1 %
Eosinophils Absolute: 0.3 10*3/uL (ref 0.0–0.5)
Eosinophils Relative: 6 %
HCT: 35.5 % — ABNORMAL LOW (ref 36.0–46.0)
Hemoglobin: 12.1 g/dL (ref 12.0–15.0)
Immature Granulocytes: 0 %
Lymphocytes Relative: 40 %
Lymphs Abs: 2.4 10*3/uL (ref 0.7–4.0)
MCH: 33.2 pg (ref 26.0–34.0)
MCHC: 34.1 g/dL (ref 30.0–36.0)
MCV: 97.3 fL (ref 80.0–100.0)
Monocytes Absolute: 1.3 10*3/uL — ABNORMAL HIGH (ref 0.1–1.0)
Monocytes Relative: 22 %
Neutro Abs: 1.8 10*3/uL (ref 1.7–7.7)
Neutrophils Relative %: 31 %
Platelets: 143 10*3/uL — ABNORMAL LOW (ref 150–400)
RBC: 3.65 MIL/uL — ABNORMAL LOW (ref 3.87–5.11)
RDW: 14.2 % (ref 11.5–15.5)
WBC: 5.9 10*3/uL (ref 4.0–10.5)
nRBC: 0 % (ref 0.0–0.2)

## 2022-05-07 LAB — COMPREHENSIVE METABOLIC PANEL
ALT: 21 U/L (ref 0–44)
AST: 34 U/L (ref 15–41)
Albumin: 2.5 g/dL — ABNORMAL LOW (ref 3.5–5.0)
Alkaline Phosphatase: 67 U/L (ref 38–126)
Anion gap: 8 (ref 5–15)
BUN: 17 mg/dL (ref 8–23)
CO2: 25 mmol/L (ref 22–32)
Calcium: 8.4 mg/dL — ABNORMAL LOW (ref 8.9–10.3)
Chloride: 108 mmol/L (ref 98–111)
Creatinine, Ser: 0.76 mg/dL (ref 0.44–1.00)
GFR, Estimated: >60 mL/min (ref >60)
Glucose, Bld: 97 mg/dL (ref 70–99)
Potassium: 4.1 mmol/L (ref 3.5–5.1)
Sodium: 141 mmol/L (ref 135–145)
Total Bilirubin: 0.6 mg/dL (ref 0.3–1.2)
Total Protein: 5.2 g/dL — ABNORMAL LOW (ref 6.5–8.1)

## 2022-05-07 LAB — MAGNESIUM: Magnesium: 1.9 mg/dL (ref 1.7–2.4)

## 2022-05-07 LAB — PHOSPHORUS: Phosphorus: 3.2 mg/dL (ref 2.5–4.6)

## 2022-05-07 MED ORDER — RIVAROXABAN 15 MG PO TABS
15.0000 mg | ORAL_TABLET | Freq: Every day | ORAL | Status: DC
  Filled 2022-05-07: qty 1

## 2022-05-07 MED ORDER — METOPROLOL TARTRATE 25 MG PO TABS: 25.0000 mg | ORAL_TABLET | Freq: Two times a day (BID) | ORAL | 1 refills | Status: AC

## 2022-05-07 MED ORDER — RIVAROXABAN 15 MG PO TABS: 15.0000 mg | ORAL_TABLET | Freq: Every day | ORAL | 1 refills | Status: AC

## 2022-05-07 NOTE — TOC Transition Note (Signed)
Transition of Care [facility]) - CM/SW Discharge Note   Patient Details  Name: Twanisha Foulk MRN: [id number] Date of Birth: 1924-12-13  Transition of [facility]) CM/SW Contact:  Oletta Lamas, MSW, Bryon Lions Phone Number: 05/07/2022, 3:09 PM   Clinical Narrative:  CSW completed med necessity form and notified pt's grandson of dc back to SNF. Nurse stated pt is ready and PTAR has been called.     Final next level of care: Skilled Nursing Facility Barriers to Discharge: No Barriers Identified   Patient Goals and CMS Choice      Discharge Placement                Patient chooses bed at:  [facility] Patient to be transferred to facility by: PTAR Name of family member notified: Jeannett Senior Patient and family notified of of transfer: 05/07/22  Discharge Plan and Services Additional resources added to the After Visit Summary for                                       Social Determinants of Health (SDOH) Interventions SDOH Screenings   Tobacco Use: Low Risk  (05/04/2022)     Readmission Risk Interventions     No data to display          Oletta Lamas, MSW, LCSWA, LCASA Transitions of Care  Clinical Social Worker I

## 2022-05-07 NOTE — Discharge Summary (Signed)
Physician Discharge Summary  Lynn Knox ZOX:[id number] DOB: Apr 16, 1925 DOA: 05/04/2022  PCP: [facility]  Admit date: 05/04/2022 Discharge date: 05/07/2022  Time spent: 40 minutes  Recommendations for Outpatient Follow-up:  Follow outpatient CBC/CMP  Xareto dose adjusted to 15 mg, follow outpatient Adjust metoprolol as needed for atrial fibrillation Repeat chest imaging in 3-4 weeks Follow abnormal echo with PCP outpt - mildly reduced RVSF, mildly elevated PASP, severe MAC Abnormal UA here with hematuria, pyuria, negative culture - follow repeat outpatient  Discharge Diagnoses:  Principal Problem:   Atrial fibrillation with RVR ([place]) Active Problems:   Hypertension   Hypothyroidism   RSV (respiratory syncytial virus pneumonia)   Hypokalemia   Lactic acidosis   Dementia with behavioral disturbance ([place])   Acute hypoxic respiratory failure ([place])   Discharge Condition: stable  Diet recommendation: heart healthy  Filed Weights   05/04/22 2300  Weight: 63.5 kg    History of present illness:  Lynn Knox is Lynn Knox 88 y.o. female with medical history significant of dementia, atrial fibrillation, COPD, HTN, hypothyroidism who presents with increase shortness of breath and cough.   She was admitted with an RSV infection and afib with RVR.  She's gradually improved.  Stable for discharge 12/29.  Hospital Course:  Assessment and Plan: Atrial fibrillation with RVR ([place]) -rates improved -TSH within normal limits -likely exacerbated by RSV -xarelto resumed, but dose increased to 15 mg dose instead of 10 mg dose for stroke risk with atrial fib -will discharge on metoprolol 25 mg twice daily -repeat echo with EF 65-70%, no RWMA, mildly reduced RVSF, mildly elevated PASP - follow outpatient    Acute hypoxic respiratory failure ([facility])  RSV infection -currently on RA -RSV + -CXR with patchy L mid lung airspace opacity -negative procalcitonin, normal WBC  count, afebrile -repeat chest imaging outpatient   Dementia with behavioral disturbance ([place]) -suspect she's near her baseline today -hard of hearing as well and with visual impairment as well, complicating this -delirium precautions -avoid antipsychotics/benzos as possible    Lactic acidosis -resolved   Hypokalemia -replace and follow outpatient   RSV (respiratory syncytial virus pneumonia) -continue symptomatic management -seems to be improving   Hypothyroidism -continue levothyroxine, TSH wnl   Right Lower Extremity Edema - neg for dvt   Hard of Hearing  Visual Impairment - noted  Abnormal UA  Microscopic Hematuria  Pyuria - negative for UTI - repeat outpatient, workup additionally as appropriate     Procedures:  Echo IMPRESSIONS     1. Left ventricular ejection fraction, by estimation, is 65 to 70%. The  left ventricle has normal function. The left ventricle has no regional  wall motion abnormalities. There is mild asymmetric left ventricular  hypertrophy of the basal-septal segment.  Diastolic function indeterminant due to severe MAC.   2. Right ventricular systolic function is mildly reduced. The right  ventricular size is normal. There is mildly elevated pulmonary artery  systolic pressure. The estimated right ventricular systolic pressure is  81.1 mmHg.   3. Left atrial size was mildly dilated.   4. Right atrial size was mildly dilated.   5. The mitral valve is degenerative. Mild mitral valve regurgitation.  Mild calcific mitral stenosis. The mean mitral valve gradient is 6.0 mmHg  at HR 114. Severe mitral annular calcification.   6. The aortic valve is tricuspid. There is moderate calcification of the  aortic valve. There is moderate thickening of the aortic valve. Aortic  valve regurgitation is mild to  moderate. Aortic valve  sclerosis/calcification is present, without any  evidence of aortic stenosis.   7. The inferior vena cava is dilated in  size with <50% respiratory  variability, suggesting right atrial pressure of 15 mmHg.   Comparison(s): Compared to prior TTE on 11/2020, RAP is now 56mHg  (previously 348mg). Otherwise, no significant change.   LE US[place]ummary:  RIGHT:  - There is no evidence of deep vein thrombosis in the lower extremity.  However, portions of this examination were limited- see technologist  comments above.    - No cystic structure found in the popliteal fossa.    LEFT:  - No evidence of common femoral vein obstruction.   Consultations: none  Discharge Exam: Vitals:   05/07/22 1142 05/07/22 1219  BP:    Pulse: 87 85  Resp: 17 17  Temp:    SpO2: 90% 93%   No new complaints today Discussed plan with Grandson  General: No acute distress. Cardiovascular: irregularly regular, controlled rate Lungs: unlabored, scattered rhonchi Abdomen: Soft, nontender, nondistended Neurological: Alert and oriented 3. Moves all extremities 4. Cranial nerves II through XII grossly intact. Extremities: No clubbing or cyanosis. No edema.  Discharge Instructions   Discharge Instructions     Call MD for:  difficulty breathing, headache or visual disturbances   Complete by: As directed    Call MD for:  extreme fatigue   Complete by: As directed    Call MD for:  hives   Complete by: As directed    Call MD for:  persistant dizziness or light-headedness   Complete by: As directed    Call MD for:  persistant nausea and vomiting   Complete by: As directed    Call MD for:  redness, tenderness, or signs of infection (pain, swelling, redness, odor or green/yellow discharge around incision site)   Complete by: As directed    Call MD for:  severe uncontrolled pain   Complete by: As directed    Call MD for:  temperature >100.4   Complete by: As directed    Diet - low sodium heart healthy   Complete by: As directed    Discharge instructions   Complete by: As directed    You were admitted with RSV and atrial  fibrillation with Lynn Knox fast heart rate.  You've improved with supportive care.  We've started you on metoprolol and will send you with this to take twice daily.    I've adjusted your xareto to Lynn Knox dose that's appropriate for stroke prevention in atrial fibrillation.  We've stopped your HCTZ and potassium.  Follow up repeat labs within Deepti Gunawan week to ensure your kidney function and electrolytes are stable.  You should have repeat chest imaging within 3-4 weeks with your PCP.  You had an abnormal UA that should be repeated as an outpatient to see if there's still blood and white blood cells present.  Your urine culture here was not consistent with Jaeley Wiker UTI.  You had an echo (ultrasound of the heart) which had some abnormal results.  Follow these up with your PCP outpatient.   Return for new, recurrent, or worsening symptoms.  Please ask your PCP to request records from this hospitalization so they know what was done and what the next steps will be.   Increase activity slowly   Complete by: As directed    No wound care   Complete by: As directed       Allergies as of 05/07/2022  Reactions   Doxycycline    Pt states makes her eye red        Medication List     STOP taking these medications    hydrochlorothiazide 25 MG tablet Commonly known as: HYDRODIURIL   potassium chloride 10 MEQ tablet Commonly known as: KLOR-CON       TAKE these medications    acetaminophen 500 MG tablet Commonly known as: TYLENOL Take 1 tablet (500 mg total) by mouth every 6 (six) hours as needed.   albuterol 108 (90 Base) MCG/ACT inhaler Commonly known as: VENTOLIN HFA Inhale 2 puffs into the lungs every 6 (six) hours as needed for wheezing or shortness of breath.   HYDROcodone-acetaminophen 5-325 MG tablet Commonly known as: NORCO/VICODIN Take 1 tablet by mouth every 6 (six) hours as needed for moderate pain.   ipratropium-albuterol 0.5-2.5 (3) MG/3ML Soln Commonly known as: DUONEB Take 3  mLs by nebulization 4 (four) times daily as needed. What changed:  when to take this reasons to take this   levothyroxine 25 MCG tablet Commonly known as: SYNTHROID Take 25 mcg by mouth daily.   melatonin 3 MG Tabs tablet Take 3 mg by mouth at bedtime.   metoprolol tartrate 25 MG tablet Commonly known as: LOPRESSOR Take 1 tablet (25 mg total) by mouth 2 (two) times daily.   montelukast 10 MG tablet Commonly known as: SINGULAIR TAKE 1 TABLET(10 MG) BY MOUTH DAILY What changed: See the new instructions.   Multivitamin Adult Tabs Take 1 tablet by mouth daily.   NUTRITIONAL DRINK PO Take 120 mLs by mouth 3 (three) times daily. House Supplement   ondansetron 4 MG tablet Commonly known as: ZOFRAN Take 1 tablet (4 mg total) by mouth every 6 (six) hours as needed for nausea.   Rivaroxaban 15 MG Tabs tablet Commonly known as: XARELTO Take 1 tablet (15 mg total) by mouth daily with breakfast. Start taking on: May 08, 2022 What changed:  medication strength how much to take when to take this   rosuvastatin 5 MG tablet Commonly known as: CRESTOR Take 5 mg by mouth at bedtime.   senna 8.6 MG Tabs tablet Commonly known as: SENOKOT Take 1 tablet (8.6 mg total) by mouth 2 (two) times daily.   sertraline 25 MG tablet Commonly known as: ZOLOFT Take 25 mg by mouth daily.       Allergies  Allergen Reactions   Doxycycline     Pt states makes her eye red      The results of significant diagnostics from this hospitalization (including imaging, microbiology, ancillary and laboratory) are listed below for reference.    Significant Diagnostic Studies: VAS [place] LOWER EXTREMITY VENOUS (DVT)  Result Date: 05/05/2022  Lower Venous DVT Study Patient Name:  TRANISE FORREST  Date of Exam:   05/05/2022 Medical Rec #: [id number]   Accession #:    [id number] Date of Birth: Aug 14, 1924   Patient Gender: F Patient Age:   88 years Exam Location:  [facility] Procedure:      VAS [place]  LOWER EXTREMITY VENOUS (DVT) Referring Phys: Meagen Limones POWELL JR --------------------------------------------------------------------------------  Indications: Edema.  Risk Factors: None identified. Limitations: Poor ultrasound/tissue interface and patient positioning, patient movement. Comparison Study: No prior studies. Performing Technologist: Oliver Hum RVT  Examination Guidelines: Kalani Sthilaire complete evaluation includes B-mode imaging, spectral Doppler, color Doppler, and power Doppler as needed of all accessible portions of each vessel. Bilateral testing is considered an integral part of Wess Baney complete examination. Limited examinations for reoccurring  indications may be performed as noted. The reflux portion of the exam is performed with the patient in reverse Trendelenburg.  +---------+---------------+---------+-----------+----------+--------------+ RIGHT    CompressibilityPhasicitySpontaneityPropertiesThrombus Aging +---------+---------------+---------+-----------+----------+--------------+ CFV      Full           Yes      Yes                                 +---------+---------------+---------+-----------+----------+--------------+ SFJ      Full                                                        +---------+---------------+---------+-----------+----------+--------------+ FV Prox  Full                                                        +---------+---------------+---------+-----------+----------+--------------+ FV Mid   Full                                                        +---------+---------------+---------+-----------+----------+--------------+ FV DistalFull                                                        +---------+---------------+---------+-----------+----------+--------------+ PFV      Full                                                        +---------+---------------+---------+-----------+----------+--------------+ POP      Full           Yes       Yes                                 +---------+---------------+---------+-----------+----------+--------------+ PTV      Full                                                        +---------+---------------+---------+-----------+----------+--------------+ PERO     Full                                                        +---------+---------------+---------+-----------+----------+--------------+   +----+---------------+---------+-----------+----------+--------------+ LEFTCompressibilityPhasicitySpontaneityPropertiesThrombus Aging +----+---------------+---------+-----------+----------+--------------+ CFV Full           Yes      Yes                                 +----+---------------+---------+-----------+----------+--------------+  Summary: RIGHT: - There is no evidence of deep vein thrombosis in the lower extremity. However, portions of this examination were limited- see technologist comments above.  - No cystic structure found in the popliteal fossa.  LEFT: - No evidence of common femoral vein obstruction.  *See table(s) above for measurements and observations. Electronically signed by Deitra Mayo MD on 05/05/2022 at 2:29:34 PM.    Final    ECHOCARDIOGRAM COMPLETE  Result Date: 05/05/2022    ECHOCARDIOGRAM REPORT   Patient Name:   [facility] Date of Exam: 05/05/2022 Medical Rec #:  [id number]  Height:       61.0 in Accession #:    [id number] Weight:       140.0 lb Date of Birth:  1925/02/13  BSA:          1.623 m Patient Age:    89 years   BP:           141/93 mmHg Patient Gender: F          HR:           113 bpm. Exam Location:  Inpatient Procedure: 2D Echo, Color Doppler and Cardiac Doppler Indications:    I48.91* Unspecified atrial fibrillation  History:        Patient has prior history of Echocardiogram examinations, most                 recent 11/11/2020. COPD, Arrythmias:Atrial Fibrillation; Risk                 Factors:Hypertension.  Sonographer:    Raquel Sarna  Senior RDCS Referring Phys: [phone] Reed Eifert CALDWELL POWELL JR  Sonographer Comments: Difficult study due to respiratory interference and thin body habitus. IMPRESSIONS  1. Left ventricular ejection fraction, by estimation, is 65 to 70%. The left ventricle has normal function. The left ventricle has no regional wall motion abnormalities. There is mild asymmetric left ventricular hypertrophy of the basal-septal segment. Diastolic function indeterminant due to severe MAC.  2. Right ventricular systolic function is mildly reduced. The right ventricular size is normal. There is mildly elevated pulmonary artery systolic pressure. The estimated right ventricular systolic pressure is 01.6 mmHg.  3. Left atrial size was mildly dilated.  4. Right atrial size was mildly dilated.  5. The mitral valve is degenerative. Mild mitral valve regurgitation. Mild calcific mitral stenosis. The mean mitral valve gradient is 6.0 mmHg at HR 114. Severe mitral annular calcification.  6. The aortic valve is tricuspid. There is moderate calcification of the aortic valve. There is moderate thickening of the aortic valve. Aortic valve regurgitation is mild to moderate. Aortic valve sclerosis/calcification is present, without any evidence of aortic stenosis.  7. The inferior vena cava is dilated in size with <50% respiratory variability, suggesting right atrial pressure of 15 mmHg. Comparison(s): Compared to prior TTE on 11/2020, RAP is now 62mHg (previously 334mg). Otherwise, no significant change. FINDINGS  Left Ventricle: Left ventricular ejection fraction, by estimation, is 65 to 70%. The left ventricle has normal function. The left ventricle has no regional wall motion abnormalities. The left ventricular internal cavity size was small. There is mild asymmetric left ventricular hypertrophy of the basal-septal segment. Diastolic function indeterminant due to severe MAC. Right Ventricle: The right ventricular size is normal. Right vetricular wall  thickness was not well visualized. Right ventricular systolic function is mildly reduced. There is mildly elevated pulmonary artery systolic pressure. The tricuspid regurgitant velocity is 2.66 m/s, and with an assumed right atrial pressure of  15 mmHg, the estimated right ventricular systolic pressure is 26.8 mmHg. Left Atrium: Left atrial size was mildly dilated. Right Atrium: Right atrial size was mildly dilated. Pericardium: There is no evidence of pericardial effusion. Mitral Valve: The mitral valve is degenerative in appearance. There is moderate thickening of the mitral valve leaflet(s). There is moderate calcification of the mitral valve leaflet(s). Severe mitral annular calcification. Mild mitral valve regurgitation. Mild mitral valve stenosis. MV peak gradient, 11.4 mmHg. The mean mitral valve gradient is 6.0 mmHg. Tricuspid Valve: The tricuspid valve is normal in structure. Tricuspid valve regurgitation is mild. Aortic Valve: The aortic valve is tricuspid. There is moderate calcification of the aortic valve. There is moderate thickening of the aortic valve. Aortic valve regurgitation is mild to moderate. Aortic valve sclerosis/calcification is present, without any evidence of aortic stenosis. Pulmonic Valve: The pulmonic valve was grossly normal. Pulmonic valve regurgitation is mild. Aorta: The aortic root is normal in size and structure. Venous: The inferior vena cava is dilated in size with less than 50% respiratory variability, suggesting right atrial pressure of 15 mmHg. IAS/Shunts: The atrial septum is grossly normal.  LEFT VENTRICLE PLAX 2D LVIDd:         3.20 cm LVIDs:         2.00 cm LV PW:         0.80 cm LV IVS:        1.00 cm LVOT diam:     1.80 cm LV SV:         35 LV SV Index:   22 LVOT Area:     2.54 cm  RIGHT VENTRICLE RV S prime:     9.03 cm/s TAPSE (M-mode): 1.4 cm LEFT ATRIUM             Index        RIGHT ATRIUM           Index LA diam:        3.70 cm 2.28 cm/m   RA Area:     14.80 cm  LA Vol (A2C):   61.0 ml 37.58 ml/m  RA Volume:   31.00 ml  19.10 ml/m LA Vol (A4C):   49.5 ml 30.50 ml/m LA Biplane Vol: 61.1 ml 37.64 ml/m  AORTIC VALVE LVOT Vmax:   85.90 cm/s LVOT Vmean:  57.700 cm/s LVOT VTI:    0.139 m  AORTA Ao Root diam: 3.00 cm MITRAL VALVE              TRICUSPID VALVE MV Area VTI:  1.35 cm    TR Peak grad:   28.3 mmHg MV Peak grad: 11.4 mmHg   TR Vmax:        266.00 cm/s MV Mean grad: 6.0 mmHg MV Vmax:      1.69 m/s    SHUNTS MV Vmean:     110.0 cm/s  Systemic VTI:  0.14 m                           Systemic Diam: 1.80 cm Gwyndolyn Kaufman MD Electronically signed by Gwyndolyn Kaufman MD Signature Date/Time: 05/05/2022/1:17:26 PM    Final    DG Chest Port 1 View  Result Date: 05/04/2022 CLINICAL DATA:  Cough and flu like symptoms EXAM: PORTABLE CHEST 1 VIEW COMPARISON:  11/10/2020 FINDINGS: Cardiac shadow is within normal limits. Aortic calcifications and mitral annular patchy airspace opacity is noted in the left mid lung likely representing early infiltrate. This  is somewhat obscured by overlying leads. No bony abnormality is noted. IMPRESSION: Patchy left mid lung airspace opacity. Electronically Signed   By: Inez Catalina M.D.   On: 05/04/2022 19:33    Microbiology: Recent Results (from the past 240 hour(s))  Blood Culture (routine x 2)     Status: None (Preliminary result)   Collection Time: 05/04/22  7:00 PM   Specimen: BLOOD RIGHT ARM  Result Value Ref Range Status   Specimen Description BLOOD RIGHT ARM  Final   Special Requests   Final    BOTTLES DRAWN AEROBIC AND ANAEROBIC Blood Culture results may not be optimal due to an excessive volume of blood received in culture bottles   Culture   Final    NO GROWTH 3 DAYS Performed at [facility], [place] [address] [place], [place] [address]    Report Status PENDING  Incomplete  Urine Culture     Status: Abnormal   Collection Time: 05/04/22  7:00 PM   Specimen: In/Out Cath Urine  Result Value Ref Range Status    Specimen Description IN/OUT CATH URINE  Final   Special Requests   Final    NONE Performed at [facility], [place] [address] [place], [place] [address]    Culture MULTIPLE SPECIES PRESENT, SUGGEST RECOLLECTION (Damari Suastegui)  Final   Report Status 05/05/2022 FINAL  Final  Resp panel by RT-PCR (RSV, Flu Avigail Pilling&B, Covid) Anterior Nasal Swab     Status: Abnormal   Collection Time: 05/04/22  7:06 PM   Specimen: Anterior Nasal Swab  Result Value Ref Range Status   SARS Coronavirus 2 by RT PCR NEGATIVE NEGATIVE Final    Comment: (NOTE) SARS-CoV-2 target nucleic acids are NOT DETECTED.  The SARS-CoV-2 RNA is generally detectable in upper respiratory specimens during the acute phase of infection. The lowest concentration of SARS-CoV-2 viral copies this assay can detect is 138 copies/mL. Sydell Prowell negative result does not preclude SARS-Cov-2 infection and should not be used as the sole basis for treatment or other patient management decisions. Elhadj Girton negative result may occur with  improper specimen collection/handling, submission of specimen other than nasopharyngeal swab, presence of viral mutation(s) within the areas targeted by this assay, and inadequate number of viral copies(<138 copies/mL). Clanton Emanuelson negative result must be combined with clinical observations, patient history, and epidemiological information. The expected result is Negative.  Fact Sheet for Patients:  EntrepreneurPulse.com.au  Fact Sheet for Healthcare Providers:  IncredibleEmployment.be  This test is no t yet approved or cleared by the [place] FDA and  has been authorized for detection and/or diagnosis of SARS-CoV-2 by FDA under an Emergency Use Authorization (EUA). This EUA will remain  in effect (meaning this test can be used) for the duration of the COVID-19 declaration under Section 564(b)(1) of the Act, 21 U.S.C.section 360bbb-3(b)(1), unless the authorization is terminated  or revoked sooner.        Influenza Dionna Wiedemann by PCR NEGATIVE NEGATIVE Final   Influenza B by PCR NEGATIVE NEGATIVE Final    Comment: (NOTE) The Xpert Xpress SARS-CoV-2/FLU/RSV plus assay is intended as an aid in the diagnosis of influenza from Nasopharyngeal swab specimens and should not be used as Artice Holohan sole basis for treatment. Nasal washings and aspirates are unacceptable for Xpert Xpress SARS-CoV-2/FLU/RSV testing.  Fact Sheet for Patients: EntrepreneurPulse.com.au  Fact Sheet for Healthcare Providers: IncredibleEmployment.be  This test is not yet approved or cleared by the [place] FDA and has been authorized for detection and/or diagnosis of SARS-CoV-2  by FDA under an Emergency Use Authorization (EUA). This EUA will remain in effect (meaning this test can be used) for the duration of the COVID-19 declaration under Section 564(b)(1) of the Act, 21 U.S.C. section 360bbb-3(b)(1), unless the authorization is terminated or revoked.     Resp Syncytial Virus by PCR POSITIVE (Angellee Cohill) NEGATIVE Final    Comment: (NOTE) Fact Sheet for Patients: EntrepreneurPulse.com.au  Fact Sheet for Healthcare Providers: IncredibleEmployment.be  This test is not yet approved or cleared by the [place] FDA and has been authorized for detection and/or diagnosis of SARS-CoV-2 by FDA under an Emergency Use Authorization (EUA). This EUA will remain in effect (meaning this test can be used) for the duration of the COVID-19 declaration under Section 564(b)(1) of the Act, 21 U.S.C. section 360bbb-3(b)(1), unless the authorization is terminated or revoked.  Performed at [facility], [place] [address], [place], [place] [address]   Blood Culture (routine x 2)     Status: None (Preliminary result)   Collection Time: 05/04/22  7:19 PM   Specimen: BLOOD  Result Value Ref Range Status   Specimen Description BLOOD LEFT ANTECUBITAL  Final   Special  Requests   Final    BOTTLES DRAWN AEROBIC AND ANAEROBIC Blood Culture results may not be optimal due to an excessive volume of blood received in culture bottles   Culture   Final    NO GROWTH 3 DAYS Performed at [facility], [place] [address] [place], [place] [address]    Report Status PENDING  Incomplete  MRSA Next Gen by PCR, Nasal     Status: None   Collection Time: 05/05/22 10:39 PM   Specimen: Nasal Mucosa; Nasal Swab  Result Value Ref Range Status   MRSA by PCR Next Gen NOT DETECTED NOT DETECTED Final    Comment: (NOTE) The GeneXpert MRSA Assay (FDA approved for NASAL specimens only), is one component of Sabre Leonetti comprehensive MRSA colonization surveillance program. It is not intended to diagnose MRSA infection nor to guide or monitor treatment for MRSA infections. Test performance is not FDA approved in patients less than 39 years old. Performed at [facility], [place] [address], [place], [place] [address]      Labs: Basic Metabolic Panel: Recent Labs  Lab 05/04/22 1900 05/06/22 0016 05/07/22 0012  NA 144 140 141  K 3.2* 3.2* 4.1  CL 103 103 108  CO2 _0 GLUCOSE 125* 88 97  BUN _1 CREATININE 0.87 0.79 0.76  CALCIUM 9.1 8.5* 8.4*  MG  --  1.7 1.9  PHOS  --  3.3 3.2   Liver Function Tests: Recent Labs  Lab 05/04/22 1900 05/06/22 0016 05/07/22 0012  AST 39 36 34  ALT _2 ALKPHOS 87 72 67  BILITOT 0.7 0.8 0.6  PROT 7.2 5.4* 5.2*  ALBUMIN 3.5 2.7* 2.5*   No results for input(s): "LIPASE", "AMYLASE" in the last 168 hours. No results for input(s): "AMMONIA" in the last 168 hours. CBC: Recent Labs  Lab 05/04/22 1900 05/05/22 0520 05/06/22 0016 05/07/22 0012  WBC 6.0 5.1 6.6 5.9  NEUTROABS 2.7  --  2.6 1.8  HGB 14.8 13.0 12.1 12.1  HCT 44.4 40.8 34.9* 35.5*  MCV 99.1 100.2* 96.1 97.3  PLT 188 166 148* 143*   Cardiac Enzymes: No results for input(s): "CKTOTAL", "CKMB", "CKMBINDEX", "TROPONINI" in the last 168 hours. BNP: BNP  (last 3 results) Recent Labs    05/04/22  1901  BNP 134.7*    ProBNP (last 3 results) No results for input(s): "PROBNP" in the last 8760 hours.  CBG: No results for input(s): "GLUCAP" in the last 168 hours.     Signed:  Fayrene Helper MD.  [facility] 05/07/2022, 2:15 PM

## 2022-05-09 LAB — CULTURE, BLOOD (ROUTINE X 2)
Culture: NO GROWTH
Culture: NO GROWTH

## 2022-06-11 ENCOUNTER — Non-Acute Institutional Stay: Payer: Medicare Other | Admitting: Hospice

## 2022-06-11 DIAGNOSIS — I4891 Unspecified atrial fibrillation: Secondary | ICD-10-CM

## 2022-06-11 DIAGNOSIS — Z515 Encounter for palliative care: Secondary | ICD-10-CM

## 2022-06-11 DIAGNOSIS — R634 Abnormal weight loss: Secondary | ICD-10-CM

## 2022-06-11 DIAGNOSIS — F03C3 Unspecified dementia, severe, with mood disturbance: Secondary | ICD-10-CM

## 2022-06-11 DIAGNOSIS — G4700 Insomnia, unspecified: Secondary | ICD-10-CM

## 2022-06-11 DIAGNOSIS — R531 Weakness: Secondary | ICD-10-CM

## 2022-06-11 DIAGNOSIS — F339 Major depressive disorder, recurrent, unspecified: Secondary | ICD-10-CM

## 2022-06-11 NOTE — Progress Notes (Signed)
[facility] Consult Note Telephone: [phone]  Fax: [phone]  PATIENT NAME: Lynn Knox [address] [place] [address] [phone] (home)  DOB: [birth year] MRN: [id number]  PRIMARY CARE PROVIDER:    Medicine, [facility],  [place] [address] [phone]  REFERRING PROVIDER:   Medicine, [facility] [address] [place],  [place] [address] [phone]  RESPONSIBLE PARTY:   [place]     Name Relation Home Work [place], [place]   [phone]        I met face to face with patient in the facility. Visit to build trust and highlight Palliative Medicine as specialized medical care for people living with serious illness, aimed at facilitating better quality of life through symptoms relief, assisting with advance care planning and complex medical decision making. Lynn Knox is with patient during visit.  ASSESSMENT AND / RECOMMENDATIONS:   Advance Care Planning: Our advance care planning conversation included a discussion about:    The value and importance of advance care planning  Difference between Hospice and Palliative care Exploration of goals of care in the event of a sudden injury or illness  Identification and preparation of a healthcare agent  Review and updating or creation of an  advance directive document . Decision not to resuscitate or to de-escalate disease focused treatments due to poor prognosis.  CODE STATUS: Lynn Knox affirmed patient is a Do Not Resuscitate.   Goals of Care: Goals include to maximize quality of life and symptom management. Family interest in hospice service when patient qualifies for it.    I spent  25 minutes providing this initial consultation. More than 50% of the time in this consultation was spent on counseling patient and coordinating  communication. --------------------------------------------------------------------------------------------------------------------------------------  Symptom Management/Plan: Dementia: worsened with recent hospitalization for RSV/pneumonia. Progressive memory loss and confusion, impoverished thought, incontinent of bowel and bladder. FAST 7a. Provide redirection as needed.  Patient is a high fall risk.  Fall/safety precautions.  Weakness: worsened with recent hospitalization. PT/OT for strengthening, transfers. Non ambulatory, stands. High fall risk. Fall precautions.  Depression: Managed with Sertraline.  Encourage socialization and participation in facility activities.  Psych consult as needed. Routine CBC CMP.  Afib: Managed with Xarelto. Cardiologist consult as needed.  Insomnia: Continue Trazadone as ordered. Maintain sleep hygiene.  Weight loss: Worsening decline in weight.  Current weight is 123 Ibs down from 132 Ibs Sept 2023, Height 5 feet 2 inches. House supplement TID.  Offer help during meals to ensure adequate oral intake.  Check weight weekly x 4  Follow up: Palliative care will continue to follow for complex medical decision making, advance care planning, and clarification of goals. Return 6 weeks or prn. Encouraged to call provider sooner with any concerns.   Family /Caregiver/Community Supports:   HOSPICE ELIGIBILITY/DIAGNOSIS: TBD  Chief Complaint: Initial Palliative care visit  HISTORY OF PRESENT ILLNESS:  Lynn Knox is a 89 y.o. year old female  with multiple morbidities requiring close monitoring and with high risk of complications and  mortality: dementia, atrial fibrillation, COPD, HTN, hypothyroidism. Recent RSV infection per hospital note: CXR with patchy L mid lung airspace opacity, negative procalcitonin, normal WBC count, afebrile, to repeat chest imaging outpatient.  Patient in no respiratory distress, O2 saturation 96% room air.  FLACC 0 History obtained from review  of EMR, discussion with primary team, caregiver, family and/or Ms.  Volanda Knox.  Review and summarization of Epic records shows history from other than patient. Rest of 10 point ROS asked and negative. Independent interpretation of tests and reviewed as needed, available labs, patient records, imaging, studies and related documents from the EMR.   PAST MEDICAL HISTORY:  Active Ambulatory Problems    Diagnosis Date Noted   Asthma 05/28/2019   Displaced fracture of greater trochanter of left femur, initial encounter for closed fracture ([place]) 11/10/2020   Atrial fibrillation ([place]) 06/23/2020   Noise-induced hearing loss of both ears 06/23/2020   Prediabetes 06/23/2020   Urinary incontinence 06/23/2020   Hypertension 11/10/2020   Atrial fibrillation with RVR ([place]) 05/04/2022   Hypothyroidism 05/04/2022   RSV (respiratory syncytial virus pneumonia) 05/04/2022   Hypokalemia 05/05/2022   Lactic acidosis 05/05/2022   Dementia with behavioral disturbance ([place]) 05/05/2022   Acute hypoxic respiratory failure ([place]) 05/05/2022   Resolved Ambulatory Problems    Diagnosis Date Noted   No Resolved Ambulatory Problems   Past Medical History:  Diagnosis Date   COPD (chronic obstructive pulmonary disease) ([place])    Hyperlipemia    Thyroid disease     SOCIAL HX:  Social History   Tobacco Use   Smoking status: Never   Smokeless tobacco: Never  Substance Use Topics   Alcohol use: Never     FAMILY HX: No family history on file.    ALLERGIES:  Allergies  Allergen Reactions   Doxycycline     Pt states makes her eye red      PERTINENT MEDICATIONS:  Outpatient Encounter Medications as of 06/11/2022  Medication Sig   acetaminophen (TYLENOL) 500 MG tablet Take 1 tablet (500 mg total) by mouth every 6 (six) hours as needed. (Patient not taking: Reported on 05/05/2022)   albuterol (VENTOLIN HFA) 108 (90 Base) MCG/ACT inhaler Inhale 2 puffs into the lungs every 6 (six) hours as needed for wheezing or  shortness of breath.   HYDROcodone-acetaminophen (NORCO/VICODIN) 5-325 MG tablet Take 1 tablet by mouth every 6 (six) hours as needed for moderate pain.   ipratropium-albuterol (DUONEB) 0.5-2.5 (3) MG/3ML SOLN Take 3 mLs by nebulization 4 (four) times daily as needed. (Patient taking differently: Take 3 mLs by nebulization every 4 (four) hours as needed (SOB, wheezing).)   levothyroxine (SYNTHROID) 25 MCG tablet Take 25 mcg by mouth daily.   melatonin 3 MG TABS tablet Take 3 mg by mouth at bedtime.   metoprolol tartrate (LOPRESSOR) 25 MG tablet Take 1 tablet (25 mg total) by mouth 2 (two) times daily.   montelukast (SINGULAIR) 10 MG tablet TAKE 1 TABLET(10 MG) BY MOUTH DAILY (Patient taking differently: Take 10 mg by mouth at bedtime.)   Multiple Vitamin (MULTIVITAMIN ADULT) TABS Take 1 tablet by mouth daily.   Nutritional Supplements (NUTRITIONAL DRINK PO) Take 120 mLs by mouth 3 (three) times daily. House Supplement   ondansetron (ZOFRAN) 4 MG tablet Take 1 tablet (4 mg total) by mouth every 6 (six) hours as needed for nausea.   Rivaroxaban (XARELTO) 15 MG TABS tablet Take 1 tablet (15 mg total) by mouth daily with breakfast.   rosuvastatin (CRESTOR) 5 MG tablet Take 5 mg by mouth at bedtime.   senna (SENOKOT) 8.6 MG TABS tablet Take 1 tablet (8.6 mg total) by mouth 2 (two) times daily.   sertraline (ZOLOFT) 25 MG tablet Take 25 mg by mouth daily.   No facility-administered encounter medications on file as of 06/11/2022.    Thank you for the opportunity to participate in  the care of Lynn Knox.  The palliative care team will continue to follow. Please call our office at [phone] if we can be of additional assistance.   Note: Portions of this note were generated with Lobbyist. Dictation errors may occur despite best attempts at proofreading.  Teodoro Spray, NP

## 2022-06-29 ENCOUNTER — Non-Acute Institutional Stay: Payer: Medicare Other | Admitting: Hospice

## 2022-06-29 DIAGNOSIS — F03C3 Unspecified dementia, severe, with mood disturbance: Secondary | ICD-10-CM

## 2022-06-29 DIAGNOSIS — F339 Major depressive disorder, recurrent, unspecified: Secondary | ICD-10-CM

## 2022-06-29 DIAGNOSIS — W19XXXD Unspecified fall, subsequent encounter: Secondary | ICD-10-CM

## 2022-06-29 DIAGNOSIS — Z515 Encounter for palliative care: Secondary | ICD-10-CM

## 2022-06-29 DIAGNOSIS — G4700 Insomnia, unspecified: Secondary | ICD-10-CM

## 2022-06-29 DIAGNOSIS — R634 Abnormal weight loss: Secondary | ICD-10-CM

## 2022-06-29 DIAGNOSIS — R531 Weakness: Secondary | ICD-10-CM

## 2022-06-29 NOTE — Progress Notes (Signed)
[facility] Consult Note Telephone: [phone]  Fax: [phone]  PATIENT NAME: Lynn Knox [address] [place] [address] [phone] (home)  DOB: [birth year] MRN: [id number]  PRIMARY CARE PROVIDER:    Medicine, [facility],  [place] [address] [phone]  REFERRING PROVIDER:   Medicine, [facility] [address],  [place] [address] [phone]  RESPONSIBLE PARTY:   [place]     Name Relation Home Work [place], [place]   [phone]        I met face to face with patient in the facility. Visit to build trust and highlight Palliative Medicine as specialized medical care for people living with serious illness, aimed at facilitating better quality of life through symptoms relief, assisting with advance care planning and complex medical decision making.   Visit consisted of counseling and education dealing with the complex and emotionally intense issues of symptom management and palliative care in the setting of serious and potentially life-threatening illness.  Palliative care f/u visit for further discussion/monitor trends of appetite, weights, monitor for functional, cognitive decline with chronic disease progression, assess any active symptoms, supportive role.  Palliative care team will continue to support patient, patient's family, and medical team.  ASSESSMENT AND / RECOMMENDATIONS:   CODE STATUS: Do Not Resuscitate.   Goals of Care: Goals include to maximize quality of life and symptom management. Family interest in hospice service when patient qualifies for it.   Symptom Management/Plan: Dementia: Progressive memory loss and confusion, impoverished thought, incontinent of bowel and bladder. FAST 7a. Provide redirection as needed.  Patient is a high fall risk.  Fall/safety precautions.   Fall: recent fall from her wheelchair to the floor 06/28/22; no injuries. Continue neurochecks per facility protocol. Check wheelchair and ensure in locked state when she is sitting down. Fall precautions. Round often  on patient to identity and address her needs.  Weakness: PT/OT for strengthening, transfers. Non ambulatory, stands. High fall risk. Fall precautions.  Depression: Managed with Sertraline.  Encourage socialization and participation in facility activities.  Psych consult as needed. Routine CBC CMP.  Insomnia: Managed with Melatonin.  Maintain sleep hygiene.  Weight loss: Worsening decline in weight.  Current weight is 124.6 Ins 06/14/22, 123 Ibs last month , overall down from 132 Ibs Sept 2023, Height 5 feet 2 inches. House supplement TID.  Offer help during meals to ensure adequate oral intake.  Check weight weekly x 4  Follow up: Palliative care will continue to follow for complex medical decision making, advance care planning, and clarification of goals. Return 6 weeks or prn. Encouraged to call provider sooner with any concerns.   Family /Caregiver/Community Supports: Patient in SNF for ongoing care  HOSPICE ELIGIBILITY/DIAGNOSIS: TBD  Chief Complaint: Follow-up visit  HISTORY OF PRESENT ILLNESS:  Lynn Knox is a 89 y.o. year old female  with multiple morbidities requiring close monitoring and with high risk of complications and  mortality: dementia, atrial fibrillation, COPD, HTN, hypothyroidism. Recent RSV infection.  Patient in no respiratory distress, denies pain/discomfort.  Patient seen in her room and also seen self propelling her wheelchair in common area.  O2 saturation 96% room air.  FLACC 0 History obtained from review of EMR, discussion with primary team, caregiver, family and/or Ms. Volanda Napoleon.  Review and summarization of Epic records shows history from other than patient. Rest of 10 point ROS  asked and negative. Independent interpretation of tests and reviewed as needed,  available labs, patient records, imaging, studies and related documents from the EMR.   PAST MEDICAL HISTORY:  Active Ambulatory Problems    Diagnosis Date Noted   Asthma 05/28/2019   Displaced fracture of greater trochanter of left femur, initial encounter for closed fracture ([place]) 11/10/2020   Atrial fibrillation ([place]) 06/23/2020   Noise-induced hearing loss of both ears 06/23/2020   Prediabetes 06/23/2020   Urinary incontinence 06/23/2020   Hypertension 11/10/2020   Atrial fibrillation with RVR ([place]) 05/04/2022   Hypothyroidism 05/04/2022   RSV (respiratory syncytial virus pneumonia) 05/04/2022   Hypokalemia 05/05/2022   Lactic acidosis 05/05/2022   Dementia with behavioral disturbance ([place]) 05/05/2022   Acute hypoxic respiratory failure ([place]) 05/05/2022   Resolved Ambulatory Problems    Diagnosis Date Noted   No Resolved Ambulatory Problems   Past Medical History:  Diagnosis Date   COPD (chronic obstructive pulmonary disease) ([place])    Hyperlipemia    Thyroid disease     SOCIAL HX:  Social History   Tobacco Use   Smoking status: Never   Smokeless tobacco: Never  Substance Use Topics   Alcohol use: Never     FAMILY HX: No family history on file.    ALLERGIES:  Allergies  Allergen Reactions   Doxycycline     Pt states makes her eye red      PERTINENT MEDICATIONS:  Outpatient Encounter Medications as of 06/29/2022  Medication Sig   acetaminophen (TYLENOL) 500 MG tablet Take 1 tablet (500 mg total) by mouth every 6 (six) hours as needed. (Patient not taking: Reported on 05/05/2022)   albuterol (VENTOLIN HFA) 108 (90 Base) MCG/ACT inhaler Inhale 2 puffs into the lungs every 6 (six) hours as needed for wheezing or shortness of breath.   HYDROcodone-acetaminophen (NORCO/VICODIN) 5-325 MG tablet Take 1 tablet by mouth every 6 (six) hours as needed for moderate pain.   ipratropium-albuterol (DUONEB) 0.5-2.5 (3) MG/3ML SOLN Take 3 mLs by nebulization 4 (four) times  daily as needed. (Patient taking differently: Take 3 mLs by nebulization every 4 (four) hours as needed (SOB, wheezing).)   levothyroxine (SYNTHROID) 25 MCG tablet Take 25 mcg by mouth daily.   melatonin 3 MG TABS tablet Take 3 mg by mouth at bedtime.   metoprolol tartrate (LOPRESSOR) 25 MG tablet Take 1 tablet (25 mg total) by mouth 2 (two) times daily.   montelukast (SINGULAIR) 10 MG tablet TAKE 1 TABLET(10 MG) BY MOUTH DAILY (Patient taking differently: Take 10 mg by mouth at bedtime.)   Multiple Vitamin (MULTIVITAMIN ADULT) TABS Take 1 tablet by mouth daily.   Nutritional Supplements (NUTRITIONAL DRINK PO) Take 120 mLs by mouth 3 (three) times daily. House Supplement   ondansetron (ZOFRAN) 4 MG tablet Take 1 tablet (4 mg total) by mouth every 6 (six) hours as needed for nausea.   Rivaroxaban (XARELTO) 15 MG TABS tablet Take 1 tablet (15 mg total) by mouth daily with breakfast.   rosuvastatin (CRESTOR) 5 MG tablet Take 5 mg by mouth at bedtime.   senna (SENOKOT) 8.6 MG TABS tablet Take 1 tablet (8.6 mg total) by mouth 2 (two) times daily.   sertraline (ZOLOFT) 25 MG tablet Take 25 mg by mouth daily.   No facility-administered encounter medications on file as of 06/29/2022.   I spent 45 minutes providing this consultation; this includes time spent with patient/family, chart review and documentation. More than 50% of the time in this consultation was  spent on counseling and coordinating communication.  Thank you for the opportunity to participate in the care of Ms. Volanda Napoleon.  The palliative care team will continue to follow. Please call our office at [phone] if we can be of additional assistance.   Note: Portions of this note were generated with Lobbyist. Dictation errors may occur despite best attempts at proofreading.  Teodoro Spray, NP

## 2022-08-09 DEATH — deceased
